# Patient Record
Sex: Female | Born: 1965 | Race: Black or African American | Hispanic: No | Marital: Single | Smoking: Never smoker
Health system: Southern US, Community
[De-identification: ages and names within clinical notes are randomized; demographics above are authoritative.]

## PROBLEM LIST (undated history)

## (undated) DIAGNOSIS — E079 Disorder of thyroid, unspecified: Secondary | ICD-10-CM

## (undated) DIAGNOSIS — F419 Anxiety disorder, unspecified: Secondary | ICD-10-CM

## (undated) DIAGNOSIS — F410 Panic disorder [episodic paroxysmal anxiety] without agoraphobia: Secondary | ICD-10-CM

## (undated) DIAGNOSIS — D369 Benign neoplasm, unspecified site: Secondary | ICD-10-CM

## (undated) DIAGNOSIS — F329 Major depressive disorder, single episode, unspecified: Secondary | ICD-10-CM

## (undated) DIAGNOSIS — E119 Type 2 diabetes mellitus without complications: Secondary | ICD-10-CM

## (undated) DIAGNOSIS — T7840XA Allergy, unspecified, initial encounter: Secondary | ICD-10-CM

## (undated) DIAGNOSIS — D329 Benign neoplasm of meninges, unspecified: Secondary | ICD-10-CM

## (undated) DIAGNOSIS — J45909 Unspecified asthma, uncomplicated: Secondary | ICD-10-CM

## (undated) DIAGNOSIS — M1990 Unspecified osteoarthritis, unspecified site: Secondary | ICD-10-CM

## (undated) DIAGNOSIS — R569 Unspecified convulsions: Secondary | ICD-10-CM

## (undated) DIAGNOSIS — F32A Depression, unspecified: Secondary | ICD-10-CM

## (undated) HISTORY — DX: Major depressive disorder, single episode, unspecified: F32.9

## (undated) HISTORY — DX: Panic disorder (episodic paroxysmal anxiety): F41.0

## (undated) HISTORY — DX: Unspecified osteoarthritis, unspecified site: M19.90

## (undated) HISTORY — DX: Benign neoplasm of meninges, unspecified: D32.9

## (undated) HISTORY — DX: Unspecified convulsions: R56.9

## (undated) HISTORY — DX: Allergy, unspecified, initial encounter: T78.40XA

## (undated) HISTORY — DX: Anxiety disorder, unspecified: F41.9

## (undated) HISTORY — DX: Depression, unspecified: F32.A

---

## 2006-06-25 HISTORY — PX: ABDOMINAL HYSTERECTOMY: SHX81

## 2009-06-25 DIAGNOSIS — D369 Benign neoplasm, unspecified site: Secondary | ICD-10-CM

## 2009-06-25 HISTORY — PX: OTHER SURGICAL HISTORY: SHX169

## 2009-06-25 HISTORY — DX: Benign neoplasm, unspecified site: D36.9

## 2014-03-04 ENCOUNTER — Encounter (HOSPITAL_COMMUNITY): Payer: Self-pay | Admitting: Emergency Medicine

## 2014-03-04 ENCOUNTER — Emergency Department (HOSPITAL_COMMUNITY)
Admission: EM | Admit: 2014-03-04 | Discharge: 2014-03-04 | Disposition: A | Discharge status: Home / Self Care | Payer: Medicare (Managed Care) | Attending: Emergency Medicine | Admitting: Emergency Medicine

## 2014-03-04 DIAGNOSIS — Z880 Allergy status to penicillin: Secondary | ICD-10-CM | POA: Diagnosis not present

## 2014-03-04 DIAGNOSIS — G43009 Migraine without aura, not intractable, without status migrainosus: Secondary | ICD-10-CM

## 2014-03-04 DIAGNOSIS — J45909 Unspecified asthma, uncomplicated: Secondary | ICD-10-CM | POA: Diagnosis not present

## 2014-03-04 DIAGNOSIS — Z86011 Personal history of benign neoplasm of the brain: Secondary | ICD-10-CM | POA: Insufficient documentation

## 2014-03-04 DIAGNOSIS — G43909 Migraine, unspecified, not intractable, without status migrainosus: Secondary | ICD-10-CM | POA: Insufficient documentation

## 2014-03-04 DIAGNOSIS — Z79899 Other long term (current) drug therapy: Secondary | ICD-10-CM | POA: Diagnosis not present

## 2014-03-04 DIAGNOSIS — IMO0002 Reserved for concepts with insufficient information to code with codable children: Secondary | ICD-10-CM | POA: Diagnosis not present

## 2014-03-04 DIAGNOSIS — F17200 Nicotine dependence, unspecified, uncomplicated: Secondary | ICD-10-CM | POA: Insufficient documentation

## 2014-03-04 DIAGNOSIS — E119 Type 2 diabetes mellitus without complications: Secondary | ICD-10-CM | POA: Diagnosis not present

## 2014-03-04 HISTORY — DX: Unspecified asthma, uncomplicated: J45.909

## 2014-03-04 HISTORY — DX: Benign neoplasm, unspecified site: D36.9

## 2014-03-04 HISTORY — DX: Type 2 diabetes mellitus without complications: E11.9

## 2014-03-04 HISTORY — DX: Disorder of thyroid, unspecified: E07.9

## 2014-03-04 MED ORDER — METOCLOPRAMIDE HCL 5 MG/ML IJ SOLN
10.0000 mg | Freq: Once | INTRAMUSCULAR | Status: AC
  Administered 2014-03-04: 10 mg via INTRAMUSCULAR
  Filled 2014-03-04: qty 2

## 2014-03-04 MED ORDER — BUTALBITAL-APAP-CAFFEINE 50-325-40 MG PO TABS
1.0000 | ORAL_TABLET | Freq: Once | ORAL | Status: AC
  Administered 2014-03-04: 1 via ORAL
  Filled 2014-03-04: qty 1

## 2014-03-04 MED ORDER — DIPHENHYDRAMINE HCL 50 MG/ML IJ SOLN
25.0000 mg | Freq: Once | INTRAMUSCULAR | Status: AC
  Administered 2014-03-04: 25 mg via INTRAMUSCULAR
  Filled 2014-03-04: qty 1

## 2014-03-04 MED ORDER — PROCHLORPERAZINE EDISYLATE 5 MG/ML IJ SOLN
10.0000 mg | Freq: Once | INTRAMUSCULAR | Status: AC
  Administered 2014-03-04: 10 mg via INTRAMUSCULAR
  Filled 2014-03-04: qty 2

## 2014-03-04 NOTE — Discharge Instructions (Signed)
Please follow up with your primary care physician in 1-2 days. If you do not have one please call the [place] number listed above. Please follow up with a neurology office to schedule a follow up appointment.  Please read all discharge instructions and return precautions.    Migraine Headache A migraine headache is an intense, throbbing pain on one or both sides of your head. A migraine can last for 30 minutes to several hours. CAUSES  The exact cause of a migraine headache is not always known. However, a migraine may be caused when nerves in the brain become irritated and release chemicals that cause inflammation. This causes pain. Certain things may also trigger migraines, such as:  Alcohol.  Smoking.  Stress.  Menstruation.  Aged cheeses.  Foods or drinks that contain nitrates, glutamate, aspartame, or tyramine.  Lack of sleep.  Chocolate.  Caffeine.  Hunger.  Physical exertion.  Fatigue.  Medicines used to treat chest pain (nitroglycerine), birth control pills, estrogen, and some blood pressure medicines. SIGNS AND SYMPTOMS  Pain on one or both sides of your head.  Pulsating or throbbing pain.  Severe pain that prevents daily activities.  Pain that is aggravated by any physical activity.  Nausea, vomiting, or both.  Dizziness.  Pain with exposure to bright lights, loud noises, or activity.  General sensitivity to bright lights, loud noises, or smells. Before you get a migraine, you may get warning signs that a migraine is coming (aura). An aura may include:  Seeing flashing lights.  Seeing bright spots, halos, or zigzag lines.  Having tunnel vision or blurred vision.  Having feelings of numbness or tingling.  Having trouble talking.  Having muscle weakness. DIAGNOSIS  A migraine headache is often diagnosed based on:  Symptoms.  Physical exam.  A CT scan or MRI of your head. These imaging tests cannot diagnose  migraines, but they can help rule out other causes of headaches. TREATMENT Medicines may be given for pain and nausea. Medicines can also be given to help prevent recurrent migraines.  HOME CARE INSTRUCTIONS  Only take over-the-counter or prescription medicines for pain or discomfort as directed by your health care provider. The use of long-term narcotics is not recommended.  Lie down in a dark, quiet room when you have a migraine.  Keep a journal to find out what may trigger your migraine headaches. For example, write down:  What you eat and drink.  How much sleep you get.  Any change to your diet or medicines.  Limit alcohol consumption.  Quit smoking if you smoke.  Get 7-9 hours of sleep, or as recommended by your health care provider.  Limit stress.  Keep lights dim if bright lights bother you and make your migraines worse. SEEK IMMEDIATE MEDICAL CARE IF:   Your migraine becomes severe.  You have a fever.  You have a stiff neck.  You have vision loss.  You have muscular weakness or loss of muscle control.  You start losing your balance or have trouble walking.  You feel faint or pass out.  You have severe symptoms that are different from your first symptoms. MAKE SURE YOU:   Understand these instructions.  Will watch your condition.  Will get help right away if you are not doing well or get worse. Document Released: 06/11/2005 Document Revised: 10/26/2013 Document Reviewed: 02/16/2013 [facility] Patient Information 2015 [place], [place]. This information is not intended to replace advice given to you by your health care provider.  Make sure you discuss any questions you have with your health care provider.

## 2014-03-04 NOTE — ED Notes (Signed)
Per EMS, pt comes from home with c/o severe migraine for the past 2 days. No fall or injury/trauma to head. No LOC. Pt A&OX4, NAD noted. 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 Pt is a diabetic. VSS: BP 140/82, P80, R18, 99% O2 rm air, CBG 99.

## 2014-03-04 NOTE — ED Notes (Addendum)
Piepenbrink,PA at bedside

## 2014-03-04 NOTE — ED Provider Notes (Signed)
CSN: [id number]     Arrival date & time 03/04/14  1235 History   First MD Initiated Contact with Patient 03/04/14 1238     Chief Complaint  Patient presents with  . Migraine     (Consider location/radiation/quality/duration/timing/severity/associated sxs/prior Treatment) HPI Comments: Patient is a 48 year old female past medical history significant for DM, asthma, benign right-sided brain tumor, coronary disease presented to the emergency department for 2 days of waxing and waning severe migraine. Describes it as throbbing in nature with associated photophobia and phonophobia. Endorses associated bilateral hand and foot tingling sensation. Patient has attempted to take her sumatriptan with no improvement of her headaches. Patient states she had a headache this severe last week. She states she is followed by a neurologist in [place], recently just moved to [place].  Patient is a 48 y.o. female presenting with migraines.  Migraine Associated symptoms include headaches.    Past Medical History  Diagnosis Date  . Diabetes mellitus without complication   . Asthma   . Benign tumor     right side of skull  . Thyroid disease    Past Surgical History  Procedure Laterality Date  . Abdominal hysterectomy     History reviewed. No pertinent family history. History  Substance Use Topics  . Smoking status: Current Every Day Smoker  . Smokeless tobacco: Not on file  . Alcohol Use: No   OB History   Grav Para Term Preterm Abortions TAB SAB Ect Mult Living                 Review of Systems  Eyes: Positive for photophobia.  Neurological: Positive for headaches. Negative for syncope.  All other systems reviewed and are negative.     Allergies  Penicillins and Iodine  Home Medications   Prior to Admission medications   Medication Sig Start Date End Date Taking? Authorizing Provider  albuterol (PROVENTIL HFA;VENTOLIN HFA) 108 (90 BASE) MCG/ACT inhaler Inhale 2 puffs into the  lungs every 6 (six) hours as needed for wheezing or shortness of breath.   Yes Historical Provider, MD  Alogliptin Benzoate (NESINA) 25 MG TABS Take 25 mg by mouth daily.   Yes Historical Provider, MD  amitriptyline (ELAVIL) 100 MG tablet Take 100 mg by mouth at bedtime.   Yes Historical Provider, MD  Beclomethasone Dipropionate (QNASL) 80 MCG/ACT AERS Place 1 spray into both nostrils 2 (two) times daily as needed (for allergies).   Yes Historical Provider, MD  budesonide-formoterol (SYMBICORT) 160-4.5 MCG/ACT inhaler Inhale 1 puff into the lungs 2 (two) times daily.   Yes Historical Provider, MD  Chlorzoxazone (LORZONE) 750 MG TABS Take 750 mg by mouth 3 (three) times daily.   Yes Historical Provider, MD  cyclobenzaprine (AMRIX) 15 MG 24 hr capsule Take 15 mg by mouth daily.   Yes Historical Provider, MD  Gabapentin Enacarbil (HORIZANT) 600 MG TBCR Take 600 mg by mouth daily.   Yes Historical Provider, MD  ibuprofen (ADVIL,MOTRIN) 800 MG tablet Take 800 mg by mouth 3 (three) times daily.   Yes Historical Provider, MD  levalbuterol (XOPENEX) 1.25 MG/3ML nebulizer solution Take 1.25 mg by nebulization every 4 (four) hours as needed for wheezing.   Yes Historical Provider, MD  levocetirizine (XYZAL) 5 MG tablet Take 5 mg by mouth every evening.   Yes Historical Provider, MD  Moxifloxacin HCl (MOXEZA) 0.5 % SOLN Place 1 drop into both eyes daily as needed (for allergies).   Yes Historical Provider, MD  SUMAtriptan (IMITREX) 100 MG  tablet Take 100 mg by mouth every 2 (two) hours as needed for migraine or headache. May repeat in 2 hours if headache persists or recurs.   Yes Historical Provider, MD  Topiramate ER 50 MG CP24 Take 50 mg by mouth daily.   Yes Historical Provider, MD  traZODone (DESYREL) 100 MG tablet Take 100 mg by mouth at bedtime.   Yes Historical Provider, MD   BP 103/69  Pulse 65  Temp(Src) 98 F (36.7 C) (Oral)  Resp 16  Ht 5\' 2"  (1.575 m)  Wt 173 lb (78.472 kg)  BMI 31.63 kg/m2   SpO2 100% Physical Exam  Nursing note and vitals reviewed. Constitutional: She is oriented to person, place, and time. She appears well-developed and well-nourished. No distress.  HENT:  Head: Normocephalic and atraumatic.  Right Ear: External ear normal.  Left Ear: External ear normal.  Nose: Nose normal.  Mouth/Throat: Oropharynx is clear and moist. No oropharyngeal exudate.  Eyes: Conjunctivae and EOM are normal. Pupils are equal, round, and reactive to light.  Neck: Normal range of motion. Neck supple.  Cardiovascular: Normal rate, regular rhythm, normal heart sounds and intact distal pulses.   Pulmonary/Chest: Effort normal and breath sounds normal. No respiratory distress.  Abdominal: Soft. There is no tenderness.  Musculoskeletal: Normal range of motion. She exhibits no edema.  Neurological: She is alert and oriented to person, place, and time. She has normal strength. No cranial nerve deficit. Gait normal. GCS eye subscore is 4. GCS verbal subscore is 5. GCS motor subscore is 6.  Sensation grossly intact.  No pronator drift.  Bilateral heel-knee-shin intact.  Skin: Skin is warm and dry. She is not diaphoretic.    ED Course  Procedures (including critical care time) Medications  prochlorperazine (COMPAZINE) injection 10 mg (10 mg Intramuscular Given 03/04/14 1316)  diphenhydrAMINE (BENADRYL) injection 25 mg (25 mg Intramuscular Given 03/04/14 1317)  metoCLOPramide (REGLAN) injection 10 mg (10 mg Intramuscular Given 03/04/14 1317)  butalbital-acetaminophen-caffeine (FIORICET, ESGIC) 50-325-40 MG per tablet 1 tablet (1 tablet Oral Given 03/04/14 1504)    Labs Review Labs Reviewed - No data to display  Imaging Review No results found.   EKG Interpretation None      MDM   Final diagnoses:  Migraine without aura and without status migrainosus, not intractable    Filed Vitals:   03/04/14 1601  BP: 103/69  Pulse: 65  Temp: 98 F (36.7 C)  Resp: 16   Afebrile, NAD,  non-toxic appearing, AAOx4.  Pt HA treated and improved while in ED.  Presentation is like pts typical HA and non concerning for [facility], ICH, Meningitis, or temporal arteritis. Pt is afebrile with no focal neuro deficits, nuchal rigidity, or change in vision. Pt is to follow up with PCP to discuss prophylactic medication. Pt verbalizes understanding and is agreeable with plan to dc. Patient is stable at time of discharge      Harlow Mares, PA-C 03/04/14 1608

## 2014-03-10 NOTE — ED Provider Notes (Signed)
Medical screening examination/treatment/procedure(s) were performed by non-physician practitioner and as supervising physician I was immediately available for consultation/collaboration.   EKG Interpretation None       Virgel Manifold, MD 03/10/14 1150

## 2014-10-05 ENCOUNTER — Ambulatory Visit (INDEPENDENT_AMBULATORY_CARE_PROVIDER_SITE_OTHER): Payer: PPO | Admitting: Emergency Medicine

## 2014-10-05 ENCOUNTER — Encounter: Payer: Self-pay | Admitting: Physician Assistant

## 2014-10-05 ENCOUNTER — Ambulatory Visit (INDEPENDENT_AMBULATORY_CARE_PROVIDER_SITE_OTHER): Payer: PPO

## 2014-10-05 VITALS — BP 124/74 | HR 97 | Temp 98.5°F | Resp 18 | Ht 62.25 in | Wt 189.0 lb

## 2014-10-05 DIAGNOSIS — J45909 Unspecified asthma, uncomplicated: Secondary | ICD-10-CM

## 2014-10-05 DIAGNOSIS — E890 Postprocedural hypothyroidism: Secondary | ICD-10-CM

## 2014-10-05 DIAGNOSIS — E785 Hyperlipidemia, unspecified: Secondary | ICD-10-CM

## 2014-10-05 DIAGNOSIS — D329 Benign neoplasm of meninges, unspecified: Secondary | ICD-10-CM | POA: Diagnosis not present

## 2014-10-05 DIAGNOSIS — G43709 Chronic migraine without aura, not intractable, without status migrainosus: Secondary | ICD-10-CM | POA: Diagnosis not present

## 2014-10-05 DIAGNOSIS — M25571 Pain in right ankle and joints of right foot: Secondary | ICD-10-CM

## 2014-10-05 DIAGNOSIS — E119 Type 2 diabetes mellitus without complications: Secondary | ICD-10-CM

## 2014-10-05 DIAGNOSIS — J3089 Other allergic rhinitis: Secondary | ICD-10-CM | POA: Diagnosis not present

## 2014-10-05 DIAGNOSIS — M1712 Unilateral primary osteoarthritis, left knee: Secondary | ICD-10-CM

## 2014-10-05 LAB — LIPID PANEL
Cholesterol: 148 mg/dL (ref 0–200)
HDL: 51 mg/dL (ref >=46)
LDL CALC: 81 mg/dL (ref 0–99)
Total CHOL/HDL Ratio: 2.9 Ratio
Triglycerides: 80 mg/dL (ref <150)
VLDL: 16 mg/dL (ref 0–40)

## 2014-10-05 LAB — POCT URINALYSIS DIPSTICK
Bilirubin, UA: NEGATIVE
Blood, UA: NEGATIVE
Ketones, UA: NEGATIVE
LEUKOCYTES UA: NEGATIVE
Nitrite, UA: NEGATIVE
PROTEIN UA: 30
Spec Grav, UA: 1.02
UROBILINOGEN UA: 0.2
pH, UA: 6.5

## 2014-10-05 LAB — COMPREHENSIVE METABOLIC PANEL
ALBUMIN: 3.9 g/dL (ref 3.5–5.2)
ALT: 20 U/L (ref 0–35)
AST: 22 U/L (ref 0–37)
Alkaline Phosphatase: 50 U/L (ref 39–117)
BUN: 7 mg/dL (ref 6–23)
CHLORIDE: 104 meq/L (ref 96–112)
CO2: 27 mEq/L (ref 19–32)
Calcium: 9.2 mg/dL (ref 8.4–10.5)
Creat: 0.97 mg/dL (ref 0.50–1.10)
Glucose, Bld: 86 mg/dL (ref 70–99)
POTASSIUM: 4.7 meq/L (ref 3.5–5.3)
Sodium: 138 mEq/L (ref 135–145)
TOTAL PROTEIN: 7.2 g/dL (ref 6.0–8.3)
Total Bilirubin: 0.2 mg/dL (ref 0.2–1.2)

## 2014-10-05 LAB — POCT UA - MICROSCOPIC ONLY
Casts, Ur, LPF, POC: NEGATIVE
WBC, Ur, HPF, POC: NEGATIVE
Yeast, UA: NEGATIVE

## 2014-10-05 LAB — TSH: TSH: 1.308 u[IU]/mL (ref 0.350–4.500)

## 2014-10-05 LAB — GLUCOSE, POCT (MANUAL RESULT ENTRY): POC GLUCOSE: 96 mg/dL (ref 70–99)

## 2014-10-05 LAB — POCT GLYCOSYLATED HEMOGLOBIN (HGB A1C): HEMOGLOBIN A1C: 5.5

## 2014-10-05 MED ORDER — LEVOTHYROXINE SODIUM 125 MCG PO TABS: 125.0000 ug | ORAL_TABLET | Freq: Every day | ORAL | Status: DC

## 2014-10-05 MED ORDER — IBUPROFEN 800 MG PO TABS: 800.0000 mg | ORAL_TABLET | Freq: Three times a day (TID) | ORAL | Status: DC

## 2014-10-05 MED ORDER — LEVOCETIRIZINE DIHYDROCHLORIDE 5 MG PO TABS: 5.0000 mg | ORAL_TABLET | Freq: Every evening | ORAL | Status: DC

## 2014-10-05 MED ORDER — SIMVASTATIN 40 MG PO TABS: 40.0000 mg | ORAL_TABLET | Freq: Every day | ORAL | Status: DC

## 2014-10-05 MED ORDER — ALOGLIPTIN BENZOATE 25 MG PO TABS: 25.0000 mg | ORAL_TABLET | Freq: Every day | ORAL | Status: DC

## 2014-10-05 MED ORDER — MONTELUKAST SODIUM 10 MG PO TABS: 10.0000 mg | ORAL_TABLET | Freq: Every day | ORAL | Status: DC

## 2014-10-05 MED ORDER — BUDESONIDE-FORMOTEROL FUMARATE 160-4.5 MCG/ACT IN AERO: 1.0000 | INHALATION_SPRAY | Freq: Two times a day (BID) | RESPIRATORY_TRACT | Status: DC

## 2014-10-05 MED ORDER — ALBUTEROL SULFATE HFA 108 (90 BASE) MCG/ACT IN AERS: 2.0000 | INHALATION_SPRAY | Freq: Four times a day (QID) | RESPIRATORY_TRACT | Status: DC | PRN

## 2014-10-05 MED ORDER — SUMATRIPTAN SUCCINATE 100 MG PO TABS: ORAL_TABLET | ORAL | Status: DC

## 2014-10-05 MED ORDER — SUMATRIPTAN SUCCINATE 100 MG PO TABS: 100.0000 mg | ORAL_TABLET | ORAL | Status: DC | PRN

## 2014-10-05 NOTE — Progress Notes (Signed)
  Medical screening examination/treatment/procedure(s) were performed by non-physician practitioner and as supervising physician I was immediately available for consultation/collaboration.     

## 2014-10-05 NOTE — Progress Notes (Signed)
HPI  Patient presents to establish care, medication refills, and knee pain.   Moved to [facility] 01/2014 for a better life from [place], [place]. Has multiple health problem and would like to get linked to specialist.   Needs refills of albuterol inhaler, symbicort, Xyzal for asthma and allergies. Has not run out of medication, but is running low. Has noticed more SOB and wheezing when weather changes for the past 2 months. No current SOB, wheezing, cough, or CP. Multiple environmental and indoor triggers. Was diagnosed with asthma at the age of 89. Has night time symptoms more days then doesn't.   Had meningioma discovered in 2011 when she first started having migraines. Is on multiple medications for migraines, but needs refills for Sumitriptan and Topiramate. Has migraines 4-5x/week and generally only sign that she is going to get one is back of neck gets stiff, but denies flashing lights, seeing spots, or odd taste/smell. Migraines are banging in quality and are triggered by allergy flares and stress. Mostly located on right side, but can be entire head. Last saw neurologist in 2012 and has since been followed by and internist in [place] who prescribes her medications. Denies changes in vision, dizziness, weakness, or anxiety.   Had thyroid removed 2011 due to mass that was determined to be benign. Due to entire thyroid removal is not hypothyroid and needs refill of levothyroxine. Has not had lapse in medication and endorses med compliance. Since surgery states that she started having problems with diabetes as well and needs refills of alogliptin. Does not monitor her diet and has notices since she has been off medication for past 2 months has gained weight. Checked fasting glucose approx 1 week ago and it was 138, but does not check often. States she walks 45 minutes 2-3x/week. Denies skin/hair changes, temp intolerance, polydipsia/phagia, numbness/tingling of hand/feet, or edema of legs.   Twisted right  ankle 1 month ago, however, has sprained same ankle several times previously. Was walking and twisted ankle then fell on same side. Had swelling that has improved. Pain is sharp and 8/10 when walking and only marginally better when resting. Pain does not radiate, but is felt medially and laterally. Epsom salt soaks temporarily relieve pain. Tries to wear high top shoes or boots for better support.   Has had left knee pain for past 8 months. Was dx with osteoarthritis previously and usually soaks knee or uses ibuprofen which both give some relief. Denies falls or trauma to knee. Pain worse in the morning and does not radiate. Sharp in quality as well. Was wearing a brace that helped, but lost it in the move. Denies loss of function/sensation/ROM, weakness, or numbness.   Smokes marijuana and tobacco daily. Drinks alcohol socially. Med allergy to PCN and iodine.   Review of Systems  As noted above.   Objective:   Physical Exam  Constitutional: She is oriented to person, place, and time. She appears well-developed and well-nourished. No distress.  Blood pressure 124/74, pulse 97, temperature 98.5 F (36.9 C), temperature source Oral, resp. rate 18, height 5' 2.25" (1.581 m), weight 189 lb (85.73 kg), SpO2 96 %.  HENT:  Head: Normocephalic and atraumatic.  Right Ear: External ear normal.  Left Ear: External ear normal.  Nose: Nose normal.  Mouth/Throat: Oropharynx is clear and moist. No oropharyngeal exudate.  Eyes: Conjunctivae and EOM are normal. Pupils are equal, round, and reactive to light. Right eye exhibits no discharge. Left eye exhibits no discharge.  Neck: Normal range  of motion. Neck supple. No JVD present. No thyromegaly present.  Cardiovascular: Normal rate, regular rhythm and normal heart sounds. Exam reveals no gallop and no friction rub.  No murmur heard.  Pulmonary/Chest: Effort normal and breath sounds normal. No respiratory distress. She has no wheezes. She has no rales.   Abdominal: Soft. Bowel sounds are normal. She exhibits no distension and no mass. There is no tenderness. There is no rebound and no guarding.  Musculoskeletal: Normal range of motion. She exhibits edema (left knee and right ankle) and tenderness.  Right knee: Normal.  Left knee: She exhibits swelling. She exhibits normal range of motion, no effusion, no ecchymosis, no deformity, no laceration, no erythema, normal alignment, no LCL laxity, normal patellar mobility, no bony tenderness, normal meniscus and no MCL laxity. Tenderness found. Medial joint line and lateral joint line tenderness noted.  Right ankle: She exhibits swelling (lateral malleolus). She exhibits normal range of motion, no ecchymosis, no deformity, no laceration and normal pulse. Tenderness. Lateral malleolus tenderness found. No medial malleolus, no AITFL, no CF ligament, no posterior TFL, no head of 5th metatarsal and no proximal fibula tenderness found. Achilles tendon normal.  Left ankle: Normal.  Lymphadenopathy:  She has no cervical adenopathy.  Neurological: She is alert and oriented to person, place, and time. She has normal reflexes. No cranial nerve deficit. She exhibits normal muscle tone. Coordination normal.  Skin: Skin is warm and dry. No rash noted. She is not diaphoretic. No erythema. No pallor.   Results for orders placed or performed in visit on 10/05/14   POCT glycosylated hemoglobin (Hb A1C)   Result  Value  Ref Range    Hemoglobin A1C  5.5    POCT glucose (manual entry)   Result  Value  Ref Range    POC Glucose  96  70 - 99 mg/dl   POCT UA - Microscopic Only   Result  Value  Ref Range    WBC, Ur, HPF, POC  neg     RBC, urine, microscopic  0-2     Bacteria, U Microscopic  4+     Mucus, UA  small     Epithelial cells, urine per micros  tntc     Crystals, Ur, HPF, POC  eng     Casts, Ur, LPF, POC  neg     Yeast, UA  neg    POCT urinalysis dipstick   Result  Value  Ref Range    Color, UA  yellow      Clarity, UA  cloudy     Glucose, UA  eng     Bilirubin, UA  neg     Ketones, UA  neg     Spec Grav, UA  1.020     Blood, UA  neg     pH, UA  6.5     Protein, UA  30     Urobilinogen, UA  0.2     Nitrite, UA  neg     Leukocytes, UA  Negative     [facility] reading (PRIMARY) by Dr. Ouida Sills. No acute fractures.   Assessment & Plan:   1. Type 2 diabetes mellitus without complication  Discussed lifestyle modifications.  - POCT glycosylated hemoglobin (Hb A1C)  - POCT glucose (manual entry)  - POCT UA - Microscopic Only  - POCT urinalysis dipstick  - Alogliptin Benzoate (NESINA) 25 MG TABS; Take 25 mg by mouth daily. Dispense: 30 tablet; Refill: 3   2. Dyslipidemia  - Lipid  panel  - Comprehensive metabolic panel  - simvastatin (ZOCOR) 40 MG tablet; Take 1 tablet (40 mg total) by mouth daily. Dispense: 30 tablet; Refill: 3   3. Postoperative hypothyroidism  - TSH  - levothyroxine (SYNTHROID, LEVOTHROID) 125 MCG tablet; Take 1 tablet (125 mcg total) by mouth daily before breakfast. Dispense: 30 tablet; Refill: 3   4. Asthma, chronic, unspecified asthma severity, uncomplicated  - budesonide-formoterol (SYMBICORT) 160-4.5 MCG/ACT inhaler; Inhale 1 puff into the lungs 2 (two) times daily. Dispense: 1 Inhaler; Refill: 4  - montelukast (SINGULAIR) 10 MG tablet; Take 1 tablet (10 mg total) by mouth at bedtime. Dispense: 30 tablet; Refill: 11  - albuterol (PROVENTIL HFA;VENTOLIN HFA) 108 (90 BASE) MCG/ACT inhaler; Inhale 2 puffs into the lungs every 6 (six) hours as needed for wheezing or shortness of breath. Dispense: 1 Inhaler; Refill: 4   5. Environmental and seasonal allergies  - levocetirizine (XYZAL) 5 MG tablet; Take 1 tablet (5 mg total) by mouth every evening. Dispense: 30 tablet; Refill: 11  - montelukast (SINGULAIR) 10 MG tablet; Take 1 tablet (10 mg total) by mouth at bedtime. Dispense: 30 tablet; Refill: 11   6. Chronic migraine without aura without status migrainosus, not  intractable  - SUMAtriptan (IMITREX) 100 MG tablet; Take 1 tablet po for migraine. May repeat in 2 hours if migraine persists or recurs. Dispense: 10 tablet; Refill: 1  - Ambulatory referral to Neurology   7. Primary osteoarthritis of left knee  - ibuprofen (ADVIL,MOTRIN) 800 MG tablet; Take 1 tablet (800 mg total) by mouth 3 (three) times daily. Dispense: 30 tablet; Refill: 2   8. Meningioma  - Ambulatory referral to Neurology   9. Ankle pain, right  Ice 3-4x daily for 15-30 minutes. Given air cast to be worn for next week.  - DG Ankle Complete Right; Future  - ibuprofen (ADVIL,MOTRIN) 800 MG tablet; Take 1 tablet (800 mg total) by mouth 3 (three) times daily. Dispense: 30 tablet; Refill: 2    Alveta Heimlich PA-C  [facility]  10/05/2014 3:43 PM

## 2014-11-05 ENCOUNTER — Ambulatory Visit: Payer: Medicare Other | Admitting: Neurology

## 2014-11-30 ENCOUNTER — Telehealth: Payer: Self-pay | Admitting: Physician Assistant

## 2014-11-30 NOTE — Telephone Encounter (Signed)
Left vmail. Knee and back problems were not addressed at last visit has ankle was evaluated so not sure what the forms are for. If theses are supports you need should come back in for further evaluation.

## 2014-12-08 ENCOUNTER — Ambulatory Visit: Payer: Medicare Other | Admitting: Neurology

## 2014-12-09 ENCOUNTER — Encounter: Payer: Self-pay | Admitting: Neurology

## 2015-02-01 ENCOUNTER — Ambulatory Visit (INDEPENDENT_AMBULATORY_CARE_PROVIDER_SITE_OTHER): Payer: Commercial Managed Care - HMO | Admitting: Family Medicine

## 2015-02-01 VITALS — BP 116/72 | HR 81 | Temp 98.4°F | Resp 16 | Ht 62.25 in | Wt 183.0 lb

## 2015-02-01 DIAGNOSIS — M1712 Unilateral primary osteoarthritis, left knee: Secondary | ICD-10-CM

## 2015-02-01 DIAGNOSIS — M1990 Unspecified osteoarthritis, unspecified site: Secondary | ICD-10-CM

## 2015-02-01 DIAGNOSIS — J3089 Other allergic rhinitis: Secondary | ICD-10-CM

## 2015-02-01 DIAGNOSIS — E119 Type 2 diabetes mellitus without complications: Secondary | ICD-10-CM | POA: Diagnosis not present

## 2015-02-01 DIAGNOSIS — D329 Benign neoplasm of meninges, unspecified: Secondary | ICD-10-CM | POA: Diagnosis not present

## 2015-02-01 DIAGNOSIS — E785 Hyperlipidemia, unspecified: Secondary | ICD-10-CM | POA: Diagnosis not present

## 2015-02-01 DIAGNOSIS — R05 Cough: Secondary | ICD-10-CM

## 2015-02-01 DIAGNOSIS — G43709 Chronic migraine without aura, not intractable, without status migrainosus: Secondary | ICD-10-CM

## 2015-02-01 DIAGNOSIS — R059 Cough, unspecified: Secondary | ICD-10-CM

## 2015-02-01 DIAGNOSIS — E890 Postprocedural hypothyroidism: Secondary | ICD-10-CM

## 2015-02-01 DIAGNOSIS — J45909 Unspecified asthma, uncomplicated: Secondary | ICD-10-CM

## 2015-02-01 LAB — POCT GLYCOSYLATED HEMOGLOBIN (HGB A1C): Hemoglobin A1C: 6

## 2015-02-01 LAB — MICROALBUMIN, URINE: Microalb, Ur: 36.6 mg/dL — ABNORMAL HIGH (ref <2.0)

## 2015-02-01 MED ORDER — CYCLOBENZAPRINE HCL ER 15 MG PO CP24: 15.0000 mg | ORAL_CAPSULE | Freq: Every day | ORAL | Status: DC

## 2015-02-01 MED ORDER — ALOGLIPTIN BENZOATE 25 MG PO TABS: 25.0000 mg | ORAL_TABLET | Freq: Every day | ORAL | Status: DC

## 2015-02-01 MED ORDER — BECLOMETHASONE DIPROPIONATE 80 MCG/ACT NA AERS: 1.0000 | INHALATION_SPRAY | Freq: Two times a day (BID) | NASAL | Status: DC | PRN

## 2015-02-01 MED ORDER — OMEPRAZOLE 40 MG PO CPDR: 40.0000 mg | DELAYED_RELEASE_CAPSULE | Freq: Every day | ORAL | Status: DC

## 2015-02-01 MED ORDER — GABAPENTIN ENACARBIL ER 600 MG PO TBCR: 600.0000 mg | EXTENDED_RELEASE_TABLET | Freq: Every day | ORAL | Status: DC

## 2015-02-01 MED ORDER — TOPIRAMATE ER 50 MG PO CAP24: 50.0000 mg | ORAL_CAPSULE | Freq: Every day | ORAL | Status: DC

## 2015-02-01 MED ORDER — ALBUTEROL SULFATE HFA 108 (90 BASE) MCG/ACT IN AERS: 2.0000 | INHALATION_SPRAY | Freq: Four times a day (QID) | RESPIRATORY_TRACT | Status: DC | PRN

## 2015-02-01 MED ORDER — MONTELUKAST SODIUM 10 MG PO TABS: 10.0000 mg | ORAL_TABLET | Freq: Every day | ORAL | Status: DC

## 2015-02-01 MED ORDER — BUDESONIDE-FORMOTEROL FUMARATE 160-4.5 MCG/ACT IN AERO: 1.0000 | INHALATION_SPRAY | Freq: Two times a day (BID) | RESPIRATORY_TRACT | Status: DC

## 2015-02-01 MED ORDER — SIMVASTATIN 40 MG PO TABS: 40.0000 mg | ORAL_TABLET | Freq: Every day | ORAL | Status: DC

## 2015-02-01 MED ORDER — HYDROCODONE-HOMATROPINE 5-1.5 MG/5ML PO SYRP: 5.0000 mL | ORAL_SOLUTION | Freq: Every evening | ORAL | Status: DC | PRN

## 2015-02-01 MED ORDER — IBUPROFEN 800 MG PO TABS: 800.0000 mg | ORAL_TABLET | Freq: Three times a day (TID) | ORAL | Status: DC

## 2015-02-01 MED ORDER — AMITRIPTYLINE HCL 100 MG PO TABS: 100.0000 mg | ORAL_TABLET | Freq: Every day | ORAL | Status: AC

## 2015-02-01 MED ORDER — TRAZODONE HCL 100 MG PO TABS: ORAL_TABLET | ORAL | Status: DC

## 2015-02-01 MED ORDER — LEVOTHYROXINE SODIUM 125 MCG PO TABS: 125.0000 ug | ORAL_TABLET | Freq: Every day | ORAL | Status: DC

## 2015-02-01 MED ORDER — LEVOCETIRIZINE DIHYDROCHLORIDE 5 MG PO TABS: 5.0000 mg | ORAL_TABLET | Freq: Every evening | ORAL | Status: DC

## 2015-02-01 MED ORDER — SUMATRIPTAN SUCCINATE 100 MG PO TABS: ORAL_TABLET | ORAL | Status: DC

## 2015-02-01 NOTE — Patient Instructions (Signed)
RESOURCE GUIDE ° °Chronic Pain Problems: °Contact [facility]  [phone] °Patients need to be referred by their primary care doctor. ° °Insufficient Money for Medicine: °Contact United Way:  call (888) 892-1162 ° °No Primary Care Doctor: °- Call [facility]  [phone] - can help you locate a primary care doctor that  accepts your insurance, provides certain services, etc. °- Physician Referral Service- 1-800-533-3463 ° °Agencies that provide inexpensive medical care: °- [facility]  [phone] °- [facility]  [phone] °- [facility]  [phone] °- [facility]  [phone] °- [facility]  [phone] °- [facility]  272-1050 ° °Medicaid-accepting [place] Providers: °- [facility]- [address] ° [phone], Mon-Fri 9am-7pm, Sat 9am-1pm °- [facility]- [address], [address] ° [phone] °- [facility]- [address], [address] ° [phone] °- [facility]- [address] [place] [address] ° [phone] °- [facility]- [address], [phone] ° Only accepts [place] Medicaid patients after they have their name  applied to their card ° °Self Pay (no insurance) in [place]: °- Sickle Cell Patients - [facility] ° [address], [phone] °- [facility]- [address] ° [phone] °      -     [facility]- [address] °      -     [facility]- see information above (Speak to Pam H if you do not have insurance) °      -  [facility] [place]- [address],  [phone] °      -  [facility]- [address] [place] [address], [phone] °      -  Dr Osei-Bonsu-  [address], [place], [phone] °      -  [facility] - [address], [phone] °      -  [facility]- [address] [place] [address], [phone], also  [address], [phone] °      -     [facility]- [address], [phone], 1st & 3rd Saturday °        every month, 10am-1pm ° -     [facility] °  [address], [place]. °  Phone:  [phone], Fax:  [phone]. Hours of Operation:  9 am - 6 pm, M-F. ° -     [facility] °  [address], [place] °  Phone: [phone], Fax: [phone]. Hours of Operation:  8:30 am - 5:30 pm, M-F. ° °[facility] °[address] °[place], [place] [address] °(336) 832-4777 ° °[facility] °[address] °[place], [place] [address] °(336) [phone] ° °1) Find a Doctor and Pay Out of Pocket °Although you won't have to find out who is covered by your insurance plan, it is a good idea to ask around and get recommendations. You will then need to call the office and see if the doctor you have chosen will accept you as a new patient and what types of options they offer for patients who are self-pay. Some doctors offer discounts or will set up payment plans for their patients who do not have insurance, but   you will need to ask so you aren't surprised when you get to your appointment. ° °2) Contact Your Local Health Department °Not all health departments have doctors that can see patients for sick visits, but many do, so it is worth a call to see if yours does. If you don't know where your local health department is, you can check in your phone book. The CDC also has a tool to help you locate your state's health department, and many state websites also have listings of all of their local health departments. ° °3) Find a Walk-in Clinic °If your illness is not likely to be very severe or complicated, you may want to try a walk in clinic. These are popping up all over the country in pharmacies, drugstores, and shopping centers. They're usually staffed by nurse practitioners or physician assistants that have been trained  to treat common illnesses and complaints. They're usually fairly quick and inexpensive. However, if you have serious medical issues or chronic medical problems, these are probably not your best option ° °STD Testing °- [place] [facility] [place], [facility], [address], [place], phone [phone] or [phone].  Monday - Friday, call for an appointment. °- [place] [facility] [place], [facility], [address], [place], phone [phone] or [phone].  Monday - Friday, call for an appointment. ° °Abuse/Neglect: °- [place] Child Abuse Hotline (336) 641-3795 °- [place] Child Abuse Hotline 800-378-5315 (After Hours) ° °Emergency Shelter:  [facility] (336) 271-5985 ° °Maternity Homes: °- [facility] (336) 275-9566 °- [facility] (704) 372-4663 ° °MRSA Hotline #:   832-7006 ° °Dental Assistance °If unable to pay or uninsured, contact:  [place] [facility] to become qualified for the adult dental clinic. ° °Patients with Medicaid: [facility] °[address], 632-0744 °[address], 510-2600 ° °If unable to pay, or uninsured, contact [place] [facility] ([phone] in [place], 842-7733 in [place]) to become qualified for the adult dental clinic ° °[facility] °[address] °[place], [place] [address] °(336) 272-4177 °www.[facility].com ° °Other Low-Cost Community Dental Services: °- [facility]- [address], [place], [place], [address], 723-1848, Ext. 123, 2nd and 4th Thursday of the month at 6:30am.  10 clients each day by appointment, can sometimes see walk-in patients if someone does not show for an appointment. °- [facility]- [address], [place], [place], [address], [phone] °- [facility]- [address], [place], [place], [address], [phone] °- [place]  [facility]- 342-8273 °- [facility]- 703-3100 °- [facility]- 570-6415 ° °     Behavioral Health Resources in the Community ° °Intensive Outpatient Programs: °[facility]      °[address] °[place], [place] °336-878-6098 °Both a day and evening program °      °[facility]     °[address]        °[place], [place] [address] °336-832-9800        ° °[facility] °[address] °[place] °336-882-2125 ° °[facility] °ACCESS LINE: 1-800-853-5163 or 336-641-4981 °[address] °[place], [place] [address] °Http://www.[email or web address].[email or web address]/adult.htm ° ° °Substance Abuse Resources: °- [facility]  336-882-2125 °- [facility] 336-784-9470 °- [facility] 336-285-9073 °- [facility] 336-845-3988 °-   Residential & Outpatient Substance Abuse Program  800-659-3381 ° °Psychological Services: °- [facility]  832-9600 °- [facility]  378-7881 °- [facility], [address], [place], ACCESS LINE: 1-800-853-5163 or 336-641-4981, Http://www.[facility].com/services/adult.htm ° °Mobile Crisis Teams:         °                               °[facility]         °Mobile Crisis Care Unit °1-877-626-1772       °      °Assertive °[facility] °[address] [place] °336-834-9664 °                                        °Interventionist °Sharon DeEsch °[address] °[place] °336-554-5454 ° °Self-Help/Support Groups: °[facility] Variety of support groups °373-1402 (call for more info) ° °Narcotics Anonymous (NA) °[facility] °[address] °[place] - 2 meetings at this location ° °Residential Treatment Programs:  °[facility]      °[address]        °[place]       °866-801-8205        ° °[facility] °[address] °[place], [place]  [address] °704-293-8524 ° °[facility]  °[address] °[place], [place] [address] °336-845-3988 °Admissions: 8am-3pm M-F ° °[facility]     °[address]        °[place], [place] [address]       °336-841-1104        ° °[facility] °[address] °[place], [place] °336-379-7146 ° °[facility] °[address] °[place], [place] °336-285-9073 ° °[facility] - Intensive Outpatient      °[address]     °[place], [place]       °852-3033        ° °[facility]     °[address] °[place], [place] °877-615-2722 or 336-784-9470 ° °[facility]), Medicaid °[address] °[place], [place] °336-227-7417 ° °[facility]                                               °[address] ° [place] °800-659-3381 ° °[place] [facility]: °[facility]- 1-888-581-9988              ° °General Therapy                                                °Julie Brannon, PhD        °[address]                                       °[place], [place] [address]         °336-349-5553   °Insurance ° °[facility]   °[address] °[place], [place] [address] °336-349-4454 ° °[facility] °[address] [place], [place] [address] °336-342-8316 °Insurance/Medicaid/sponsorship through [facility] ° °[facility]                                              °[address]                                        °[place], [place] [address]    °Therapy/tele-psych/case         °336-342-8316        °  °[facility] °[address]  ° [place], [place]  [address]  °Adolescent/group home/case management °336-349-2233  °                                         °Julia Brannon PhD       °General therapy       °Insurance   °336-951-0000        ° °Dr. Arfeen, Insurance, M-F °336- 349-4544 ° °[facility] [place]  [facility]  [employer] [place] [facility] °[address]                 [address]         [address]  °[place]                                               [place]                              [place] °Phone:  [phone]                                  Phone:  [phone]                   Phone:  342-8140 ° °[facility], 342-8316 °- [place] [facility] - [facility]- 1-888-581-9988 °      -     [facility] in , [address], °            336-349-4454, Insurance ° °[place] Child Abuse Hotline °(336) 342-1394 or (336) 342-3537 (After Hours) ° ° °

## 2015-02-01 NOTE — Progress Notes (Signed)
Chief Complaint:  Chief Complaint  Patient presents with  . Medication Refill    Topamax, Sumitriptan and Horizant  . Migraine    HPI: Misty Franco is a 49 y.o. female who reports to [facility] today complaining of:  Needs medication for her migraine HAs, she did not get all of her meds as she thought she was going to. She was referred to neuro but did not make it since she does not have a car and rides her bike around. But when it is really hot she can;t because her sathma flares up. Sh ehas ahd a hx of  Migraine headache and has all sorts of  trigggers, she recently has had headaches for the last 1/2 month, last month she had it for th ewhoe month. She is having chills and sweats, LMP was 2008, partial hysterctomy due to benign fibroids.  She has spots and and also numbness and tingling in her arms and and lhands and legs. Has nause and vomiting with HA. She has had worsening sxs. She was only on imitrex and not her prior regiment so that is why she is having these long lasting headaches. She was taking Horizant/gabapentin, topomax 100 mg and imitrex and has had injections of imitrex in the past. Had meningioma discovered in 2011 when she first started having migraines. Is on multiple medications for migraines, but needs refills for Sumitriptan and Topiramate. Has migraines 4-5x/week and generally only sign that she is going to get one is back of neck gets stiff, but denies flashing lights, seeing spots, or odd taste/smell. Migraines are banging in quality and are triggered by allergy flares and stress. Mostly located on right side, but can be entire head. Last saw neurologist in 2012 and has since been followed by and internist in [place] who prescribes her medications. Denies changes in vision, dizziness  Please see Misty's note from prior with good detailed hx of all her current ailments.   She has chronic SOB due to asthma, she has nebulizers. Needs refills onmeds  She needs refill son her  thyroid meds as well.   She needs hers PPI for GERD  She also needs meds for her insomnia she was on trazadone and also amitryptiline  She also needs her diabetes medication    Past Medical History  Diagnosis Date  . Diabetes mellitus without complication   . Asthma   . Benign tumor     right side of skull  . Thyroid disease   . Allergy   . Anxiety   . Arthritis   . Depression   . Meningioma    Past Surgical History  Procedure Laterality Date  . Abdominal hysterectomy     History   Social History  . Marital Status: Single    Spouse Name: N/A  . Number of Children: N/A  . Years of Education: N/A   Social History Main Topics  . Smoking status: Never Smoker   . Smokeless tobacco: Never Used  . Alcohol Use: No  . Drug Use: 7.00 per week    Special: Marijuana     Comment: use 7x week   . Sexual Activity: Not on file   Other Topics Concern  . None   Social History Narrative   History reviewed. No pertinent family history. Allergies  Allergen Reactions  . Penicillins Swelling  . Iodine Dermatitis   Prior to Admission medications   Medication Sig Start Date End Date Taking? Authorizing Provider  albuterol (PROVENTIL  HFA;VENTOLIN HFA) 108 (90 BASE) MCG/ACT inhaler Inhale 2 puffs into the lungs every 6 (six) hours as needed for wheezing or shortness of breath. 10/05/14  Yes Misty R Brewington, PA-C  amitriptyline (ELAVIL) 100 MG tablet Take 100 mg by mouth at bedtime.   Yes Historical Provider, MD  Beclomethasone Dipropionate (QNASL) 80 MCG/ACT AERS Place 1 spray into both nostrils 2 (two) times daily as needed (for allergies).   Yes Historical Provider, MD  budesonide-formoterol (SYMBICORT) 160-4.5 MCG/ACT inhaler Inhale 1 puff into the lungs 2 (two) times daily. 10/05/14  Yes Misty R Brewington, PA-C  Gabapentin Enacarbil (HORIZANT) 600 MG TBCR Take 600 mg by mouth daily.   Yes Historical Provider, MD  ibuprofen (ADVIL,MOTRIN) 800 MG tablet Take 1 tablet (800  mg total) by mouth 3 (three) times daily. 10/05/14  Yes Misty R Brewington, PA-C  Ibuprofen-Famotidine 800-26.6 MG TABS Take by mouth.   Yes Historical Provider, MD  levalbuterol (XOPENEX) 1.25 MG/3ML nebulizer solution Take 1.25 mg by nebulization every 4 (four) hours as needed for wheezing.   Yes Historical Provider, MD  levothyroxine (SYNTHROID, LEVOTHROID) 125 MCG tablet Take 1 tablet (125 mcg total) by mouth daily before breakfast. 10/05/14  Yes Misty R Brewington, PA-C  montelukast (SINGULAIR) 10 MG tablet Take 1 tablet (10 mg total) by mouth at bedtime. 10/05/14  Yes Misty R Brewington, PA-C  simvastatin (ZOCOR) 40 MG tablet Take 1 tablet (40 mg total) by mouth daily. 10/05/14  Yes Misty R Brewington, PA-C  SUMAtriptan (IMITREX) 100 MG tablet Take 1 tablet po for migraine. May repeat in 2 hours if migraine persists or recurs. 10/05/14  Yes Misty R Brewington, PA-C  Topiramate ER 50 MG CP24 Take 50 mg by mouth daily.   Yes Historical Provider, MD  traZODone (DESYREL) 100 MG tablet Take 100 mg by mouth at bedtime.   Yes Historical Provider, MD  Vitamin D, Ergocalciferol, (DRISDOL) 50000 UNITS CAPS capsule Take 50,000 Units by mouth every 7 (seven) days.   Yes Historical Provider, MD  Alogliptin Benzoate (NESINA) 25 MG TABS Take 25 mg by mouth daily. 10/05/14   Misty R Brewington, PA-C  Chlorzoxazone (LORZONE) 750 MG TABS Take 750 mg by mouth 3 (three) times daily.    Historical Provider, MD  cyclobenzaprine (AMRIX) 15 MG 24 hr capsule Take 15 mg by mouth daily.    Historical Provider, MD  Moxifloxacin HCl (MOXEZA) 0.5 % SOLN Place 1 drop into both eyes daily as needed (for allergies).    Historical Provider, MD  olopatadine (PATANOL) 0.1 % ophthalmic solution 1 drop 2 (two) times daily.    Historical Provider, MD  omeprazole (PRILOSEC) 40 MG capsule Take 40 mg by mouth daily.    Historical Provider, MD     ROS: The patient denies fevers, chills, night sweats, unintentional weight loss,  chest pain, palpitations, wheezing, dyspnea on exertion, nausea, vomiting, abdominal pain, dysuria, hematuria, melena  All other systems have been reviewed and were otherwise negative with the exception of those mentioned in the HPI and as above.    PHYSICAL EXAM: Filed Vitals:   02/01/15 0814  BP: 116/72  Pulse: 81  Temp: 98.4 F (36.9 C)  Resp: 16   Body mass index is 33.21 kg/(m^2).   General: Alert, no acute distress HEENT:  Normocephalic, atraumatic, oropharynx patent. EOMI, PERRLA, fudnoc exam nl, tm normal Cardiovascular:  Regular rate and rhythm, no rubs murmurs or gallops.  No Carotid bruits, radial pulse intact. No pedal edema.  Respiratory: Clear to  auscultation bilaterally.  No wheezes, rales, or rhonchi.  No cyanosis, no use of accessory musculature Abdominal: No organomegaly, abdomen is soft and non-tender, positive bowel sounds. No masses. Skin: No rashes. Neurologic: Facial musculature symmetric. Psychiatric: Patient acts appropriately throughout our interaction. Lymphatic: No cervical or submandibular lymphadenopathy Musculoskeletal: Gait intact. No edema, + minimal tender right ankle malleoli 5/5 strength, 2/2 DTRS   LABS: Results for orders placed or performed in visit on 02/01/15  POCT glycosylated hemoglobin (Hb A1C)  Result Value Ref Range   Hemoglobin A1C 6.0      EKG/XRAY:   Primary read interpreted by Dr. Marin Comment at [facility].   ASSESSMENT/PLAN: Encounter Diagnoses  Name Primary?  . Chronic migraine without aura without status migrainosus, not intractable Yes  . Meningioma   . Type 2 diabetes mellitus without complication   . Postoperative hypothyroidism   . Asthma, chronic, unspecified asthma severity, uncomplicated   . Arthritis   . Dyslipidemia   . Environmental and seasonal allergies   . Primary osteoarthritis of left knee   . Cough    Labs pending Refilled meds but I have decided that she should be on 1 and not 2 insomnia meds, I have asked  her to taper down and then stop trazadone over 2 week period, she can cont with Amitrytiline Also I am only going to give her 1 msk relaxer rather than 2 Her migraine headaches are complicated and so is her medicine regiment, we will need speciality care for this Refer to neurology for migraine headaches  Refer to [facility] for improved continuity of care Rx hycodan for cough since when she coughs exacerbates her asthma nd incontinence She was given a ankle sweedo for right ankle sprain thathas not improved  Gross sideeffects, risk and benefits, and alternatives of medications d/w patient. Patient is aware that all medications have potential sideeffects and we are unable to predict every sideeffect or drug-drug interaction that may occur.  Thao Le DO  02/01/2015 9:26 AM

## 2015-02-02 DIAGNOSIS — E119 Type 2 diabetes mellitus without complications: Secondary | ICD-10-CM | POA: Insufficient documentation

## 2015-02-02 DIAGNOSIS — M1990 Unspecified osteoarthritis, unspecified site: Secondary | ICD-10-CM | POA: Insufficient documentation

## 2015-02-02 DIAGNOSIS — D329 Benign neoplasm of meninges, unspecified: Secondary | ICD-10-CM | POA: Insufficient documentation

## 2015-02-02 DIAGNOSIS — J45909 Unspecified asthma, uncomplicated: Secondary | ICD-10-CM | POA: Insufficient documentation

## 2015-02-02 DIAGNOSIS — G43709 Chronic migraine without aura, not intractable, without status migrainosus: Secondary | ICD-10-CM | POA: Insufficient documentation

## 2015-02-02 DIAGNOSIS — J3089 Other allergic rhinitis: Secondary | ICD-10-CM | POA: Insufficient documentation

## 2015-02-02 DIAGNOSIS — E890 Postprocedural hypothyroidism: Secondary | ICD-10-CM | POA: Insufficient documentation

## 2015-02-02 DIAGNOSIS — E785 Hyperlipidemia, unspecified: Secondary | ICD-10-CM | POA: Insufficient documentation

## 2015-02-25 ENCOUNTER — Encounter: Payer: Self-pay | Admitting: Family Medicine

## 2015-03-01 ENCOUNTER — Ambulatory Visit (INDEPENDENT_AMBULATORY_CARE_PROVIDER_SITE_OTHER): Payer: Commercial Managed Care - HMO | Admitting: Diagnostic Neuroimaging

## 2015-03-01 ENCOUNTER — Encounter: Payer: Self-pay | Admitting: Diagnostic Neuroimaging

## 2015-03-01 VITALS — BP 130/82 | HR 76 | Ht 62.25 in | Wt 198.0 lb

## 2015-03-01 DIAGNOSIS — G43101 Migraine with aura, not intractable, with status migrainosus: Secondary | ICD-10-CM | POA: Insufficient documentation

## 2015-03-01 DIAGNOSIS — D329 Benign neoplasm of meninges, unspecified: Secondary | ICD-10-CM

## 2015-03-01 DIAGNOSIS — G43709 Chronic migraine without aura, not intractable, without status migrainosus: Secondary | ICD-10-CM

## 2015-03-01 MED ORDER — RIZATRIPTAN BENZOATE 10 MG PO TBDP: 10.0000 mg | ORAL_TABLET | ORAL | Status: DC | PRN

## 2015-03-01 MED ORDER — TOPIRAMATE ER 50 MG PO CAP24: 100.0000 mg | ORAL_CAPSULE | Freq: Every day | ORAL | Status: DC

## 2015-03-01 MED ORDER — TOPIRAMATE ER 50 MG PO CAP24: 50.0000 mg | ORAL_CAPSULE | Freq: Every day | ORAL | Status: DC

## 2015-03-01 NOTE — Progress Notes (Signed)
[facility]  PATIENT: Misty Franco DOB: Oct 16, 1965  REFERRING CLINICIAN: T Le  HISTORY FROM: patient  REASON FOR VISIT: new consult     HISTORICAL  CHIEF COMPLAINT:  Chief Complaint  Patient presents with  . Migraine    rm 7, New Patient,    HISTORY OF PRESENT ILLNESS:   49 year old right-handed female here for evaluation of migraines and meningioma. Patient has history of diabetes, hypercholesteremia, depression, anxiety, muscle spasm, anxiety, panic attack, asthma.  Patient reports history of migraine headaches since age 68 years old. She describes pounding, exploding sensation on front, back, global or bitemporal regions. Sometimes headaches are unilateral. These are associated at times with nausea, vomiting, photophobia, phonophobia. Sometimes she sees stars and sparkles before the headache. Sometimes she feels dizziness and presyncope symptoms leading up to the headaches. Triggering factors include stress, sunlight, asthma, asthma inhalers.  Patient has been treated with topiramate, gabapentin, sumatriptan, with mild relief in the past. Patient was having 1-3 days of headache per week. In the past 3 months she has been having 6-7 days of headache per week. Sometimes headaches last up to 2-3 weeks that time.  Patient moved here from [place] and November 2015. Patient now try to establish medical care locally.   REVIEW OF SYSTEMS: Full 14 system review of systems performed and notable only for insomnia snoring headache numbness weakness dizziness depression anxiety decreased energy hallucinations allergy skin sensitivity joint pain joint swelling aching muscles feeling hot and cold increased thirst flushing a bruising blurred vision double vision shortness of breath wheezing snoring swelling in legs hearing loss in the right ear fevers chills weight gain fatigue.  ALLERGIES: Allergies  Allergen Reactions  . Penicillins Swelling  . Iodine Dermatitis     HOME MEDICATIONS: Outpatient Prescriptions Prior to Visit  Medication Sig Dispense Refill  . albuterol (PROVENTIL HFA;VENTOLIN HFA) 108 (90 BASE) MCG/ACT inhaler Inhale 2 puffs into the lungs every 6 (six) hours as needed for wheezing or shortness of breath. 1 Inhaler 5  . Alogliptin Benzoate (NESINA) 25 MG TABS Take 25 mg by mouth daily. 30 tablet 5  . amitriptyline (ELAVIL) 100 MG tablet Take 1 tablet (100 mg total) by mouth at bedtime. 30 tablet 5  . Beclomethasone Dipropionate (QNASL) 80 MCG/ACT AERS Place 1 spray into both nostrils 2 (two) times daily as needed (for allergies). 1 Inhaler 5  . budesonide-formoterol (SYMBICORT) 160-4.5 MCG/ACT inhaler Inhale 1 puff into the lungs 2 (two) times daily. 1 Inhaler 5  . cyclobenzaprine (AMRIX) 15 MG 24 hr capsule Take 1 capsule (15 mg total) by mouth daily. 30 capsule 5  . ibuprofen (ADVIL,MOTRIN) 800 MG tablet Take 1 tablet (800 mg total) by mouth 3 (three) times daily. 30 tablet 5  . levalbuterol (XOPENEX) 1.25 MG/3ML nebulizer solution Take 1.25 mg by nebulization every 4 (four) hours as needed for wheezing.    Marland Kitchen levocetirizine (XYZAL) 5 MG tablet Take 1 tablet (5 mg total) by mouth every evening. 30 tablet 5  . levothyroxine (SYNTHROID, LEVOTHROID) 125 MCG tablet Take 1 tablet (125 mcg total) by mouth daily before breakfast. 30 tablet 5  . montelukast (SINGULAIR) 10 MG tablet Take 1 tablet (10 mg total) by mouth at bedtime. 30 tablet 5  . omeprazole (PRILOSEC) 40 MG capsule Take 1 capsule (40 mg total) by mouth daily. 30 capsule 5  . simvastatin (ZOCOR) 40 MG tablet Take 1 tablet (40 mg total) by mouth daily. 30 tablet 5  . traZODone (DESYREL) 100  MG tablet Take 1 tab po daily at night for 7 days then 1/2 tab PO daily at night for 7 days, this is a taper. Then stop 30 tablet 0  . Gabapentin Enacarbil (HORIZANT) 600 MG TBCR Take 600 mg by mouth daily. May substitute with generic 30 tablet 5  . SUMAtriptan (IMITREX) 100 MG tablet Take 1  tablet po for migraine. May repeat in 2 hours if migraine persists or recurs. No more than 200 mg in 24 hour period 10 tablet 5  . Topiramate ER 50 MG CP24 Take 50 mg by mouth daily. 30 capsule 5  . Chlorzoxazone (LORZONE) 750 MG TABS Take 750 mg by mouth 3 (three) times daily.    . Ibuprofen-Famotidine 800-26.6 MG TABS Take by mouth.    . Moxifloxacin HCl (MOXEZA) 0.5 % SOLN Place 1 drop into both eyes daily as needed (for allergies).    Marland Kitchen olopatadine (PATANOL) 0.1 % ophthalmic solution 1 drop 2 (two) times daily.    . Vitamin D, Ergocalciferol, (DRISDOL) 50000 UNITS CAPS capsule Take 50,000 Units by mouth every 7 (seven) days.    Marland Kitchen HYDROcodone-homatropine (HYCODAN) 5-1.5 MG/5ML syrup Take 5 mLs by mouth at bedtime as needed. 60 mL 0   No facility-administered medications prior to visit.    PAST MEDICAL HISTORY: Past Medical History  Diagnosis Date  . Diabetes mellitus without complication   . Asthma   . 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 Thyroid disease   . Allergy   . Anxiety   . Arthritis   . Depression   . Meningioma   . Panic attacks     PAST SURGICAL HISTORY: Past Surgical History  Procedure Laterality Date  . Abdominal hysterectomy  2008    partial  . Thyroidectomy  2011    partial    FAMILY HISTORY: Family History  Problem Relation Age of Onset  . Thyroid disease Mother   . Diabetes Daughter   . Asthma Daughter     SOCIAL HISTORY:  Social History   Social History  . Marital Status: Single    Spouse Name: N/A  . Number of Children: 3  . Years of Education: 12   Occupational History  . disabled     since 2012, not worked since 2010   Social History Main Topics  . Smoking status: Never Smoker   . Smokeless tobacco: Never Used  . Alcohol Use: No     Comment: 03/01/15 "wine on occasion"  . Drug Use: 7.00 per week    Special: Marijuana     Comment: use 7x week , 03/01/15" marijuana almost daily"  . Sexual Activity: Not on file   Other  Topics Concern  . Not on file   Social History Narrative   Lives alone in home   03/01/15 No caffeine use at this time     PHYSICAL EXAM  GENERAL EXAM/CONSTITUTIONAL: Vitals:  Filed Vitals:   03/01/15 0740  BP: 130/82  Pulse: 76  Height: 5' 2.25" (1.581 m)  Weight: 198 lb (89.812 kg)     Body mass index is 35.93 kg/(m^2).  Visual Acuity Screening   Right eye Left eye Both eyes  Without correction: 20/20 20/20   With correction:        Patient is in no distress; well developed, nourished and groomed; neck is supple  CARDIOVASCULAR:  Examination of carotid arteries is normal; no carotid bruits  Regular rate and rhythm, no murmurs  Examination of  peripheral vascular system by observation and palpation is normal  EYES:  Ophthalmoscopic exam of optic discs and posterior segments is normal; no papilledema or hemorrhages  MUSCULOSKELETAL:  Gait, strength, tone, movements noted in Neurologic exam below  NEUROLOGIC: MENTAL STATUS:  No flowsheet data found.  awake, alert, oriented to person, place and time  recent and remote memory intact  normal attention and concentration  language fluent, comprehension intact, naming intact,   fund of knowledge appropriate  CRANIAL NERVE:   2nd - no papilledema on fundoscopic exam  2nd, 3rd, 4th, 6th - pupils equal and reactive to light, visual fields full to confrontation, extraocular muscles intact, no nystagmus  5th - facial sensation symmetric  7th - facial strength symmetric  8th - hearing intact  9th - palate elevates symmetrically, uvula midline  11th - shoulder shrug symmetric  12th - tongue protrusion midline  MOTOR:   normal bulk and tone, full strength in the BUE, BLE  SENSORY:   normal and symmetric to light touch, pinprick, temperature, vibration   COORDINATION:   finger-nose-finger, fine finger movements normal  REFLEXES:   deep tendon reflexes present and symmetric  GAIT/STATION:    narrow based gait; able to walk tandem; romberg is negative    DIAGNOSTIC DATA (LABS, IMAGING, TESTING) - I reviewed patient records, labs, notes, testing and imaging myself where available.  No results found for: WBC, HGB, HCT, MCV, PLT    Component Value Date/Time   NA 138 10/05/2014 0936   K 4.7 10/05/2014 0936   CL 104 10/05/2014 0936   CO2 27 10/05/2014 0936   GLUCOSE 86 10/05/2014 0936   BUN 7 10/05/2014 0936   CREATININE 0.97 10/05/2014 0936   CALCIUM 9.2 10/05/2014 0936   PROT 7.2 10/05/2014 0936   ALBUMIN 3.9 10/05/2014 0936   AST 22 10/05/2014 0936   ALT 20 10/05/2014 0936   ALKPHOS 50 10/05/2014 0936   BILITOT 0.2 10/05/2014 0936   Lab Results  Component Value Date   CHOL 148 10/05/2014   HDL 51 10/05/2014   LDLCALC 81 10/05/2014   TRIG 80 10/05/2014   CHOLHDL 2.9 10/05/2014   Lab Results  Component Value Date   HGBA1C 6.0 02/01/2015   No results found for: [id number] Lab Results  Component Value Date   TSH 1.308 10/05/2014       ASSESSMENT AND PLAN  49 y.o. year old female here with history of migraine with aura since age 70 years old. Now with increasing headaches over past 3-4 months. Also with history of meningioma in the past. Will repeat MRI scan of the brain. Will increase her topiramate up to 100 mg daily. Change sumatriptan to rizatriptan.    Dx: Migraine with aura and with status migrainosus, not intractable - Plan: MR Brain W Wo Contrast  Meningioma - Plan: MR Brain W Wo Contrast, Topiramate ER 50 MG CP24  Chronic migraine without aura without status migrainosus, not intractable - Plan: Topiramate ER 50 MG CP24    PLAN:  Orders Placed This Encounter  Procedures  . MR Brain W Wo Contrast   Meds ordered this encounter  Medications  . DISCONTD: Topiramate ER 50 MG CP24    Sig: Take 50 mg by mouth daily.    Dispense:  60 capsule    Refill:  12  . rizatriptan (MAXALT-MLT) 10 MG disintegrating tablet    Sig: Take 1 tablet (10  mg total) by mouth as needed for migraine. May repeat in 2 hours if  needed    Dispense:  9 tablet    Refill:  11  . Topiramate ER 50 MG CP24    Sig: Take 100 mg by mouth daily.    Dispense:  60 capsule    Refill:  12   Return in about 3 months (around 05/31/2015).    Penni Bombard, MD 08/25/2023, 4:27 AM Certified in Neurology, Neurophysiology and Neuroimaging  [facility] [address] [place], [place] [address] [phone]

## 2015-03-01 NOTE — Patient Instructions (Signed)
Increase topiramate ER to 100mg  daily.  Stop sumatriptan. Try rizatriptan instead.  I will check MRI brain.

## 2015-03-07 ENCOUNTER — Telehealth: Payer: Self-pay

## 2015-03-07 NOTE — Telephone Encounter (Addendum)
PA is needed for Amrix. I tried to have pharm run Rx through w/coding from the manufacturer, but pt has Medicare. Coupon does not work with Medicare, and can't be run through for cash pay in order to use coupon if she is eligible for Medicare. Dr Marin Comment, do you want to Rx just the regular cyclobenzaprine or another muscle relax that may be covered?  I ordered flexeril for her. Thanks

## 2015-03-11 ENCOUNTER — Other Ambulatory Visit: Payer: Self-pay

## 2015-03-11 MED ORDER — RIZATRIPTAN BENZOATE 10 MG PO TBDP: 10.0000 mg | ORAL_TABLET | ORAL | Status: DC | PRN

## 2015-03-14 MED ORDER — CYCLOBENZAPRINE HCL 10 MG PO TABS: 10.0000 mg | ORAL_TABLET | Freq: Two times a day (BID) | ORAL | Status: DC | PRN

## 2015-03-28 ENCOUNTER — Ambulatory Visit
Admission: RE | Admit: 2015-03-28 | Discharge: 2015-03-28 | Disposition: A | Discharge status: Home / Self Care | Payer: Commercial Managed Care - HMO | Source: Ambulatory Visit | Attending: Diagnostic Neuroimaging | Admitting: Diagnostic Neuroimaging

## 2015-03-28 DIAGNOSIS — G43101 Migraine with aura, not intractable, with status migrainosus: Secondary | ICD-10-CM | POA: Diagnosis not present

## 2015-03-28 DIAGNOSIS — D329 Benign neoplasm of meninges, unspecified: Secondary | ICD-10-CM

## 2015-03-28 MED ORDER — GADOBENATE DIMEGLUMINE 529 MG/ML IV SOLN
17.0000 mL | Freq: Once | INTRAVENOUS | Status: AC | PRN
  Administered 2015-03-28: 17 mL via INTRAVENOUS

## 2015-04-28 ENCOUNTER — Other Ambulatory Visit: Payer: Self-pay | Admitting: Family Medicine

## 2015-06-01 DIAGNOSIS — E039 Hypothyroidism, unspecified: Secondary | ICD-10-CM | POA: Diagnosis not present

## 2015-06-01 DIAGNOSIS — J45909 Unspecified asthma, uncomplicated: Secondary | ICD-10-CM | POA: Diagnosis not present

## 2015-06-01 DIAGNOSIS — Z0000 Encounter for general adult medical examination without abnormal findings: Secondary | ICD-10-CM | POA: Diagnosis not present

## 2015-06-01 DIAGNOSIS — E782 Mixed hyperlipidemia: Secondary | ICD-10-CM | POA: Diagnosis not present

## 2015-06-01 DIAGNOSIS — Z6833 Body mass index (BMI) 33.0-33.9, adult: Secondary | ICD-10-CM | POA: Diagnosis not present

## 2015-06-01 DIAGNOSIS — E1169 Type 2 diabetes mellitus with other specified complication: Secondary | ICD-10-CM | POA: Diagnosis not present

## 2015-06-01 DIAGNOSIS — E1140 Type 2 diabetes mellitus with diabetic neuropathy, unspecified: Secondary | ICD-10-CM | POA: Diagnosis not present

## 2015-06-01 DIAGNOSIS — E669 Obesity, unspecified: Secondary | ICD-10-CM | POA: Diagnosis not present

## 2015-06-04 ENCOUNTER — Other Ambulatory Visit: Payer: Self-pay | Admitting: Family Medicine

## 2015-06-07 ENCOUNTER — Ambulatory Visit: Payer: Commercial Managed Care - HMO | Admitting: Diagnostic Neuroimaging

## 2015-07-22 ENCOUNTER — Encounter: Payer: Self-pay | Admitting: Diagnostic Neuroimaging

## 2015-07-22 ENCOUNTER — Ambulatory Visit (INDEPENDENT_AMBULATORY_CARE_PROVIDER_SITE_OTHER): Payer: Commercial Managed Care - HMO | Admitting: Diagnostic Neuroimaging

## 2015-07-22 VITALS — BP 133/82 | HR 87 | Ht 62.25 in | Wt 216.2 lb

## 2015-07-22 DIAGNOSIS — D329 Benign neoplasm of meninges, unspecified: Secondary | ICD-10-CM | POA: Diagnosis not present

## 2015-07-22 DIAGNOSIS — G43101 Migraine with aura, not intractable, with status migrainosus: Secondary | ICD-10-CM

## 2015-07-22 MED ORDER — TOPIRAMATE 50 MG PO TABS: 50.0000 mg | ORAL_TABLET | Freq: Two times a day (BID) | ORAL | Status: DC

## 2015-07-22 MED ORDER — RIZATRIPTAN BENZOATE 10 MG PO TBDP: 10.0000 mg | ORAL_TABLET | ORAL | Status: AC | PRN

## 2015-07-22 NOTE — Patient Instructions (Signed)
-   repeat MRI brain in 1 year  - restart topiramate 50mg  twice a day - continue rizatriptan as needed  - consider migraine research study (observational)

## 2015-07-22 NOTE — Progress Notes (Signed)
[facility]  PATIENT: Misty Franco DOB: June 01, 1966  REFERRING CLINICIAN: T Le  HISTORY FROM: patient  REASON FOR VISIT: follow up     HISTORICAL  CHIEF COMPLAINT:  Chief Complaint  Patient presents with  . Migraine    rm 7, "migraines 1-2/wk, Ibuprofen, Maxalt, ice for HA- not very helpful, stress brings them on; cannot afford Trokendi or Duexis"  . Follow-up    3 month    HISTORY OF PRESENT ILLNESS:   UPDATE 07/22/15: Since last visit, tried trokendi, which helped, but then ins did not pay for med. Rizatriptan helping better than sumatriptan. HA are 1-2 per week. Usually triggered by stress, sunlight or neck pain. Has been off trokendi since Oct 2016.   PRIOR HPI (03/01/15): 50 year old right-handed female here for evaluation of migraines and meningioma. Patient has history of diabetes, hypercholesteremia, depression, anxiety, muscle spasm, anxiety, panic attack, asthma. Patient reports history of migraine headaches since age 77 years old. She describes pounding, exploding sensation on front, back, global or bitemporal regions. Sometimes headaches are unilateral. These are associated at times with nausea, vomiting, photophobia, phonophobia. Sometimes she sees stars and sparkles before the headache. Sometimes she feels dizziness and presyncope symptoms leading up to the headaches. Triggering factors include stress, sunlight, asthma, asthma inhalers. Patient has been treated with topiramate, gabapentin, sumatriptan, with mild relief in the past. Patient was having 1-3 days of headache per week. In the past 3 months she has been having 6-7 days of headache per week. Sometimes headaches last up to 2-3 weeks that time. Patient moved here from [place] and November 2015. Patient now try to establish medical care locally.   REVIEW OF SYSTEMS: Full 14 system review of systems performed and notable only for insomnia snoring headache numbness weakness dizziness depression  anxiety decreased energy hallucinations allergy skin sensitivity joint pain joint swelling aching muscles feeling hot and cold increased thirst flushing a bruising blurred vision double vision shortness of breath wheezing snoring swelling in legs hearing loss in the right ear fevers chills weight gain fatigue.   ALLERGIES: Allergies  Allergen Reactions  . Penicillins Swelling  . Iodine Dermatitis    HOME MEDICATIONS: Outpatient Prescriptions Prior to Visit  Medication Sig Dispense Refill  . albuterol (PROVENTIL HFA;VENTOLIN HFA) 108 (90 BASE) MCG/ACT inhaler Inhale 2 puffs into the lungs every 6 (six) hours as needed for wheezing or shortness of breath. 1 Inhaler 5  . Alogliptin Benzoate (NESINA) 25 MG TABS Take 25 mg by mouth daily. 30 tablet 5  . amitriptyline (ELAVIL) 100 MG tablet Take 1 tablet (100 mg total) by mouth at bedtime. 30 tablet 5  . Beclomethasone Dipropionate (QNASL) 80 MCG/ACT AERS Place 1 spray into both nostrils 2 (two) times daily as needed (for allergies). 1 Inhaler 5  . budesonide-formoterol (SYMBICORT) 160-4.5 MCG/ACT inhaler Inhale 1 puff into the lungs 2 (two) times daily. 1 Inhaler 5  . Chlorzoxazone (LORZONE) 750 MG TABS Take 750 mg by mouth 3 (three) times daily.    . cyclobenzaprine (FLEXERIL) 10 MG tablet TAKE 1 TABLET BY MOUTH TWICE DAILY AS NEEDED FOR MUSCLE SPASM 60 tablet 1  . gabapentin (NEURONTIN) 600 MG tablet     . ibuprofen (ADVIL,MOTRIN) 800 MG tablet Take 1 tablet (800 mg total) by mouth 3 (three) times daily. 30 tablet 5  . Ibuprofen-Famotidine 800-26.6 MG TABS Take by mouth.    . levalbuterol (XOPENEX) 1.25 MG/3ML nebulizer solution Take 1.25 mg by nebulization every 4 (four) hours as  needed for wheezing.    Marland Kitchen levocetirizine (XYZAL) 5 MG tablet Take 1 tablet (5 mg total) by mouth every evening. 30 tablet 5  . levothyroxine (SYNTHROID, LEVOTHROID) 125 MCG tablet Take 1 tablet (125 mcg total) by mouth daily before breakfast. 30 tablet 5  .  montelukast (SINGULAIR) 10 MG tablet Take 1 tablet (10 mg total) by mouth at bedtime. 30 tablet 5  . Moxifloxacin HCl (MOXEZA) 0.5 % SOLN Place 1 drop into both eyes daily as needed (for allergies).    Marland Kitchen olopatadine (PATANOL) 0.1 % ophthalmic solution 1 drop 2 (two) times daily.    Marland Kitchen omeprazole (PRILOSEC) 40 MG capsule Take 1 capsule (40 mg total) by mouth daily. 30 capsule 5  . rizatriptan (MAXALT-MLT) 10 MG disintegrating tablet Take 1 tablet (10 mg total) by mouth as needed for migraine. May repeat in 2 hours if needed 27 tablet 3  . simvastatin (ZOCOR) 40 MG tablet Take 1 tablet (40 mg total) by mouth daily. 30 tablet 5  . traZODone (DESYREL) 100 MG tablet Take 1 tab po daily at night for 7 days then 1/2 tab PO daily at night for 7 days, this is a taper. Then stop 30 tablet 0  . Vitamin D, Ergocalciferol, (DRISDOL) 50000 UNITS CAPS capsule Take 50,000 Units by mouth every 7 (seven) days.    . Topiramate ER 50 MG CP24 Take 100 mg by mouth daily. 60 capsule 12   No facility-administered medications prior to visit.    PAST MEDICAL HISTORY: Past Medical History  Diagnosis Date  . Diabetes mellitus without complication ([place])   . Asthma   . 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 Thyroid disease   . Allergy   . Anxiety   . Arthritis   . Depression   . Meningioma ([place])   . Panic attacks     PAST SURGICAL HISTORY: Past Surgical History  Procedure Laterality Date  . Abdominal hysterectomy  2008    partial  . Thyroidectomy  2011    partial    FAMILY HISTORY: Family History  Problem Relation Age of Onset  . Thyroid disease Mother   . Diabetes Daughter   . Asthma Daughter     SOCIAL HISTORY:  Social History   Social History  . Marital Status: Single    Spouse Name: N/A  . Number of Children: 3  . Years of Education: 12   Occupational History  . disabled     since 2012, not worked since 2010   Social History Main Topics  . Smoking status: Never Smoker   .  Smokeless tobacco: Never Used  . Alcohol Use: No     Comment: 03/01/15 "wine on occasion"  . Drug Use: 7.00 per week    Special: Marijuana     Comment: use 7x week , 03/01/15" marijuana almost daily"  . Sexual Activity: Not on file   Other Topics Concern  . Not on file   Social History Narrative   Lives alone in home   03/01/15 No caffeine use at this time     PHYSICAL EXAM  GENERAL EXAM/CONSTITUTIONAL: Vitals:  Filed Vitals:   07/22/15 0811  BP: 133/82  Pulse: 87  Height: 5' 2.25" (1.581 m)  Weight: 216 lb 3.2 oz (98.068 kg)   Body mass index is 39.23 kg/(m^2). No exam data present  Patient is in no distress; well developed, nourished and groomed; neck is supple  CARDIOVASCULAR:  Examination of carotid arteries  is normal; no carotid bruits  Regular rate and rhythm, no murmurs  Examination of peripheral vascular system by observation and palpation is normal  EYES:  Ophthalmoscopic exam of optic discs and posterior segments is normal; no papilledema or hemorrhages  MUSCULOSKELETAL:  Gait, strength, tone, movements noted in Neurologic exam below  NEUROLOGIC: MENTAL STATUS:  No flowsheet data found.  awake, alert, oriented to person, place and time  recent and remote memory intact  normal attention and concentration  language fluent, comprehension intact, naming intact,   fund of knowledge appropriate  CRANIAL NERVE:   2nd - no papilledema on fundoscopic exam  2nd, 3rd, 4th, 6th - pupils equal and reactive to light, visual fields full to confrontation, extraocular muscles intact, no nystagmus  5th - facial sensation symmetric  7th - facial strength symmetric  8th - hearing intact  9th - palate elevates symmetrically, uvula midline  11th - shoulder shrug symmetric  12th - tongue protrusion midline  MOTOR:   normal bulk and tone, full strength in the BUE, BLE  SENSORY:   normal and symmetric to light touch, temperature, vibration    COORDINATION:   finger-nose-finger, fine finger movements normal  REFLEXES:   deep tendon reflexes present and symmetric  GAIT/STATION:   narrow based gait; able to walk tandem; romberg is negative    DIAGNOSTIC DATA (LABS, IMAGING, TESTING) - I reviewed patient records, labs, notes, testing and imaging myself where available.  No results found for: WBC, HGB, HCT, MCV, PLT    Component Value Date/Time   NA 138 10/05/2014 0936   K 4.7 10/05/2014 0936   CL 104 10/05/2014 0936   CO2 27 10/05/2014 0936   GLUCOSE 86 10/05/2014 0936   BUN 7 10/05/2014 0936   CREATININE 0.97 10/05/2014 0936   CALCIUM 9.2 10/05/2014 0936   PROT 7.2 10/05/2014 0936   ALBUMIN 3.9 10/05/2014 0936   AST 22 10/05/2014 0936   ALT 20 10/05/2014 0936   ALKPHOS 50 10/05/2014 0936   BILITOT 0.2 10/05/2014 0936   Lab Results  Component Value Date   CHOL 148 10/05/2014   HDL 51 10/05/2014   LDLCALC 81 10/05/2014   TRIG 80 10/05/2014   CHOLHDL 2.9 10/05/2014   Lab Results  Component Value Date   HGBA1C 6.0 02/01/2015   No results found for: [id number] Lab Results  Component Value Date   TSH 1.308 10/05/2014       ASSESSMENT AND PLAN  50 y.o. year old female here with history of migraine with aura since age 69 years old. Now with increasing headaches since June 2016. MRI brain shows meningioma, possibly slightly increased in size compared to 3 yrs ago per patient.  Trokendi helped, but insurance did not cover. Will restart regular topiramate. Continue rizatriptan as needed.   Dx:  Migraine with aura and with status migrainosus, not intractable  Meningioma ([facility])    PLAN: - repeat MRI brain in 1 year - restart topiramate 50mg  BID - continue rizatriptan as needed - consider migraine research study (observational)  Meds ordered this encounter  Medications  . topiramate (TOPAMAX) 50 MG tablet    Sig: Take 1 tablet (50 mg total) by mouth 2 (two) times daily.    Dispense:  60  tablet    Refill:  12  . rizatriptan (MAXALT-MLT) 10 MG disintegrating tablet    Sig: Take 1 tablet (10 mg total) by mouth as needed for migraine. May repeat in 2 hours if needed    Dispense:  27 tablet    Refill:  3   No Follow-up on file.    Penni Bombard, MD [id number], [id number] AM Certified in Neurology, Neurophysiology and Neuroimaging  [facility] [address], [place], [place] [address] [phone]

## 2015-07-25 ENCOUNTER — Telehealth: Payer: Self-pay | Admitting: Diagnostic Neuroimaging

## 2015-07-25 NOTE — Telephone Encounter (Signed)
I spoke with Misty Franco.  I told her that my reason for the call was to schedule her for a research study.  We scheduled an appointment for Friday, Feb. 10th at 9:30am.

## 2015-08-01 ENCOUNTER — Other Ambulatory Visit: Payer: Self-pay | Admitting: Family Medicine

## 2015-08-01 ENCOUNTER — Ambulatory Visit
Admission: RE | Admit: 2015-08-01 | Discharge: 2015-08-01 | Disposition: A | Discharge status: Home / Self Care | Payer: Commercial Managed Care - HMO | Source: Ambulatory Visit | Attending: Diagnostic Neuroimaging | Admitting: Diagnostic Neuroimaging

## 2015-08-01 DIAGNOSIS — D329 Benign neoplasm of meninges, unspecified: Secondary | ICD-10-CM

## 2015-08-01 MED ORDER — GADOBENATE DIMEGLUMINE 529 MG/ML IV SOLN
20.0000 mL | Freq: Once | INTRAVENOUS | Status: AC | PRN
  Administered 2015-08-01: 20 mL via INTRAVENOUS

## 2015-08-05 ENCOUNTER — Encounter (INDEPENDENT_AMBULATORY_CARE_PROVIDER_SITE_OTHER): Payer: Self-pay

## 2015-08-05 ENCOUNTER — Telehealth: Payer: Self-pay

## 2015-08-05 DIAGNOSIS — Z0289 Encounter for other administrative examinations: Secondary | ICD-10-CM

## 2015-08-05 NOTE — Telephone Encounter (Signed)
PA needed for generic nesina taken for DM. Started on covermymeds but need to get hx of other DM meds tried/failed in past. We do not have any other meds in our records. LMOM for CB

## 2015-08-08 ENCOUNTER — Telehealth: Payer: Self-pay | Admitting: Diagnostic Neuroimaging

## 2015-08-08 DIAGNOSIS — R937 Abnormal findings on diagnostic imaging of other parts of musculoskeletal system: Secondary | ICD-10-CM

## 2015-08-08 DIAGNOSIS — R930 Abnormal findings on diagnostic imaging of skull and head, not elsewhere classified: Secondary | ICD-10-CM

## 2015-08-08 NOTE — Telephone Encounter (Signed)
i callled patient and reviewed results. MRI brain looks stable. However there is small spot on c-spine, possible syrinx, which I will follow up with MRI cervical spine. -VRP

## 2015-08-10 ENCOUNTER — Telehealth: Payer: Self-pay | Admitting: *Deleted

## 2015-08-10 ENCOUNTER — Telehealth: Payer: Self-pay | Admitting: Diagnostic Neuroimaging

## 2015-08-10 NOTE — Telephone Encounter (Signed)
PA was denied because pt has not tried/failed preferred alternatives: Tradjenta, Januvia and Onglyza. Dr Marin Comment, do you want to change pt to one of these?

## 2015-08-10 NOTE — Telephone Encounter (Signed)
Marlynn A. Orman called.  She stated that her ClinCard for the [facility] study was not activated.  She found this out after calling the customer service number for [employer].  The customer service person instructed her to call us.  I told her that I would let Rizwan know about the situation and that I would call her back once her card was activated.

## 2015-08-10 NOTE — Telephone Encounter (Signed)
PA started with Lenis Dickinson, [phone]. PA request form faxed to [facility] for Rizatriptan.

## 2015-08-10 NOTE — Telephone Encounter (Signed)
Pt called back and reported that she had tried metformin in the past, but it was not effective for her. She has been taking Nesina for about 2 years with good effectiveness. Completed PA. Pending.

## 2015-08-11 ENCOUNTER — Other Ambulatory Visit: Payer: Self-pay | Admitting: Family Medicine

## 2015-08-11 NOTE — Telephone Encounter (Signed)
Left message that her PA for Alogliptan/Nesina was not approved, asked if she has used Januvia before which is in the same DPP IV class . If so then would add that to the metformin. She should be on metformin and also [place]. Also she neds to come in for a recheck if she has not established care with a PCP in the next 1-2 months, especially if she has not been on metformin or nesina.

## 2015-08-11 NOTE — Telephone Encounter (Signed)
Gave patient dr Marin Comment message and pt states she has only took metformin and nesina nothing else   Please call back

## 2015-08-12 ENCOUNTER — Telehealth: Payer: Self-pay

## 2015-08-12 ENCOUNTER — Telehealth: Payer: Self-pay | Admitting: Family Medicine

## 2015-08-12 NOTE — Telephone Encounter (Signed)
Msg is for Dr. Marin Comment, pt wants to speak to her personally.  Please advise  [phone]

## 2015-08-12 NOTE — Telephone Encounter (Signed)
Left message to tell me dose and frequency of Meetformin, also we need to changes NEsina to Januvia. She neds to let me know if she is ok withthis to make appropriate adjustment to pharmacy.

## 2015-08-13 NOTE — Telephone Encounter (Signed)
Dr. Le  Please see previous message 

## 2015-08-17 MED ORDER — SITAGLIPTIN PHOS-METFORMIN HCL 50-500 MG PO TABS: 1.0000 | ORAL_TABLET | Freq: Two times a day (BID) | ORAL | Status: DC

## 2015-08-17 NOTE — Telephone Encounter (Signed)
Spoke with patient about meds, she has not been on metformin in a while maybe last time was in 2009/2011. Did nto work.  Last a1c was 5.2 on Nesina. Will go ahead and put her on low dose Metformin and also Januvia  To see if A1C well controlled. Will come back for labs in 1-2 months.

## 2015-08-18 ENCOUNTER — Other Ambulatory Visit: Payer: Commercial Managed Care - HMO

## 2015-08-29 ENCOUNTER — Encounter: Payer: Self-pay | Admitting: *Deleted

## 2015-08-29 NOTE — Progress Notes (Signed)
Received letter from [facility] stating patient does not need PA for Rizatriptan 10 mg ODT. It is available at amount listed in her plan at any pharmacy that participates with [facility].

## 2015-08-30 ENCOUNTER — Ambulatory Visit
Admission: RE | Admit: 2015-08-30 | Discharge: 2015-08-30 | Disposition: A | Discharge status: Home / Self Care | Payer: Commercial Managed Care - HMO | Source: Ambulatory Visit | Attending: Diagnostic Neuroimaging | Admitting: Diagnostic Neuroimaging

## 2015-08-30 DIAGNOSIS — R937 Abnormal findings on diagnostic imaging of other parts of musculoskeletal system: Secondary | ICD-10-CM

## 2015-08-30 DIAGNOSIS — M4802 Spinal stenosis, cervical region: Secondary | ICD-10-CM | POA: Diagnosis not present

## 2015-08-30 DIAGNOSIS — R930 Abnormal findings on diagnostic imaging of skull and head, not elsewhere classified: Secondary | ICD-10-CM

## 2015-08-30 MED ORDER — GADOBENATE DIMEGLUMINE 529 MG/ML IV SOLN
20.0000 mL | Freq: Once | INTRAVENOUS | Status: AC | PRN
  Administered 2015-08-30: 19 mL via INTRAVENOUS

## 2015-08-31 ENCOUNTER — Ambulatory Visit (INDEPENDENT_AMBULATORY_CARE_PROVIDER_SITE_OTHER): Payer: Commercial Managed Care - HMO | Admitting: Family Medicine

## 2015-08-31 VITALS — BP 120/80 | HR 81 | Temp 98.8°F | Resp 17 | Ht 62.5 in | Wt 210.0 lb

## 2015-08-31 DIAGNOSIS — T161XXA Foreign body in right ear, initial encounter: Secondary | ICD-10-CM | POA: Diagnosis not present

## 2015-08-31 DIAGNOSIS — Z1159 Encounter for screening for other viral diseases: Secondary | ICD-10-CM | POA: Diagnosis not present

## 2015-08-31 DIAGNOSIS — M1990 Unspecified osteoarthritis, unspecified site: Secondary | ICD-10-CM

## 2015-08-31 DIAGNOSIS — J3089 Other allergic rhinitis: Secondary | ICD-10-CM

## 2015-08-31 DIAGNOSIS — M1712 Unilateral primary osteoarthritis, left knee: Secondary | ICD-10-CM

## 2015-08-31 DIAGNOSIS — E119 Type 2 diabetes mellitus without complications: Secondary | ICD-10-CM

## 2015-08-31 DIAGNOSIS — G43709 Chronic migraine without aura, not intractable, without status migrainosus: Secondary | ICD-10-CM

## 2015-08-31 DIAGNOSIS — D329 Benign neoplasm of meninges, unspecified: Secondary | ICD-10-CM

## 2015-08-31 DIAGNOSIS — E890 Postprocedural hypothyroidism: Secondary | ICD-10-CM

## 2015-08-31 DIAGNOSIS — J4540 Moderate persistent asthma, uncomplicated: Secondary | ICD-10-CM | POA: Diagnosis not present

## 2015-08-31 DIAGNOSIS — Z114 Encounter for screening for human immunodeficiency virus [HIV]: Secondary | ICD-10-CM | POA: Diagnosis not present

## 2015-08-31 DIAGNOSIS — E785 Hyperlipidemia, unspecified: Secondary | ICD-10-CM | POA: Diagnosis not present

## 2015-08-31 DIAGNOSIS — R6889 Other general symptoms and signs: Secondary | ICD-10-CM | POA: Diagnosis not present

## 2015-08-31 DIAGNOSIS — G43101 Migraine with aura, not intractable, with status migrainosus: Secondary | ICD-10-CM

## 2015-08-31 DIAGNOSIS — S00451A Superficial foreign body of right ear, initial encounter: Secondary | ICD-10-CM

## 2015-08-31 DIAGNOSIS — J45909 Unspecified asthma, uncomplicated: Secondary | ICD-10-CM

## 2015-08-31 LAB — POCT CBC
GRANULOCYTE PERCENT: 57.5 % (ref 37–80)
HEMATOCRIT: 35.6 % — AB (ref 37.7–47.9)
HEMOGLOBIN: 12.8 g/dL (ref 12.2–16.2)
LYMPH, POC: 2.2 (ref 0.6–3.4)
MCH, POC: 34.3 pg — AB (ref 27–31.2)
MCHC: 36 g/dL — AB (ref 31.8–35.4)
MCV: 95.3 fL (ref 80–97)
MID (cbc): 0.5 (ref 0–0.9)
MPV: 8.1 fL (ref 0–99.8)
POC GRANULOCYTE: 3.7 (ref 2–6.9)
POC LYMPH %: 34.6 % (ref 10–50)
POC MID %: 7.9 % (ref 0–12)
Platelet Count, POC: 320 10*3/uL (ref 142–424)
RBC: 3.73 M/uL — AB (ref 4.04–5.48)
RDW, POC: 14.4 %
WBC: 6.4 10*3/uL (ref 4.6–10.2)

## 2015-08-31 LAB — COMPREHENSIVE METABOLIC PANEL
ALBUMIN: 3.8 g/dL (ref 3.6–5.1)
ALK PHOS: 58 U/L (ref 33–115)
ALT: 20 U/L (ref 6–29)
AST: 22 U/L (ref 10–35)
BUN: 6 mg/dL — AB (ref 7–25)
CO2: 22 mmol/L (ref 20–31)
CREATININE: 1.05 mg/dL (ref 0.50–1.10)
Calcium: 8.8 mg/dL (ref 8.6–10.2)
Chloride: 107 mmol/L (ref 98–110)
Glucose, Bld: 98 mg/dL (ref 65–99)
Potassium: 3.8 mmol/L (ref 3.5–5.3)
SODIUM: 138 mmol/L (ref 135–146)
TOTAL PROTEIN: 7 g/dL (ref 6.1–8.1)
Total Bilirubin: 0.2 mg/dL (ref 0.2–1.2)

## 2015-08-31 LAB — LIPID PANEL
CHOLESTEROL: 152 mg/dL (ref 125–200)
HDL: 36 mg/dL — ABNORMAL LOW (ref >=46)
LDL Cholesterol: 95 mg/dL (ref <130)
Total CHOL/HDL Ratio: 4.2 Ratio (ref <=5.0)
Triglycerides: 107 mg/dL (ref <150)
VLDL: 21 mg/dL (ref <30)

## 2015-08-31 LAB — POCT GLYCOSYLATED HEMOGLOBIN (HGB A1C): Hemoglobin A1C: 6

## 2015-08-31 LAB — POCT INFLUENZA A/B
INFLUENZA B, POC: NEGATIVE
Influenza A, POC: NEGATIVE

## 2015-08-31 LAB — HIV ANTIBODY (ROUTINE TESTING W REFLEX): HIV 1&2 Ab, 4th Generation: NONREACTIVE

## 2015-08-31 LAB — GLUCOSE, POCT (MANUAL RESULT ENTRY): POC Glucose: 112 mg/dl — AB (ref 70–99)

## 2015-08-31 MED ORDER — SIMVASTATIN 40 MG PO TABS: 40.0000 mg | ORAL_TABLET | Freq: Every day | ORAL | Status: DC

## 2015-08-31 MED ORDER — OSELTAMIVIR PHOSPHATE 75 MG PO CAPS: 75.0000 mg | ORAL_CAPSULE | Freq: Two times a day (BID) | ORAL | Status: DC

## 2015-08-31 MED ORDER — ALBUTEROL SULFATE HFA 108 (90 BASE) MCG/ACT IN AERS: 2.0000 | INHALATION_SPRAY | Freq: Four times a day (QID) | RESPIRATORY_TRACT | Status: DC | PRN

## 2015-08-31 MED ORDER — LEVOCETIRIZINE DIHYDROCHLORIDE 5 MG PO TABS: 5.0000 mg | ORAL_TABLET | Freq: Every evening | ORAL | Status: AC

## 2015-08-31 MED ORDER — OMEPRAZOLE 40 MG PO CPDR: 40.0000 mg | DELAYED_RELEASE_CAPSULE | Freq: Every day | ORAL | Status: DC

## 2015-08-31 MED ORDER — FLUOXETINE HCL 20 MG PO TABS: 20.0000 mg | ORAL_TABLET | Freq: Every day | ORAL | Status: DC

## 2015-08-31 MED ORDER — PREDNISONE 20 MG PO TABS: ORAL_TABLET | ORAL | Status: AC

## 2015-08-31 MED ORDER — BECLOMETHASONE DIPROPIONATE 80 MCG/ACT NA AERS: 1.0000 | INHALATION_SPRAY | Freq: Two times a day (BID) | NASAL | Status: AC | PRN

## 2015-08-31 MED ORDER — MONTELUKAST SODIUM 10 MG PO TABS: 10.0000 mg | ORAL_TABLET | Freq: Every day | ORAL | Status: AC

## 2015-08-31 MED ORDER — MUCINEX DM 30-600 MG PO TB12: 1.0000 | ORAL_TABLET | Freq: Two times a day (BID) | ORAL | Status: DC

## 2015-08-31 MED ORDER — LEVOTHYROXINE SODIUM 125 MCG PO TABS: 125.0000 ug | ORAL_TABLET | Freq: Every day | ORAL | Status: DC

## 2015-08-31 MED ORDER — METFORMIN HCL 500 MG PO TABS: 500.0000 mg | ORAL_TABLET | Freq: Two times a day (BID) | ORAL | Status: DC

## 2015-08-31 MED ORDER — BUDESONIDE-FORMOTEROL FUMARATE 160-4.5 MCG/ACT IN AERO: 2.0000 | INHALATION_SPRAY | Freq: Two times a day (BID) | RESPIRATORY_TRACT | Status: DC

## 2015-08-31 NOTE — Progress Notes (Signed)
Subjective:    Patient ID: Misty Franco, female    DOB: July 30, 1965, 50 y.o.   MRN: [id number]  08/31/2015  Medication Refill; Cough; and Depression   HPI This 50 y.o. female presents for three week illness.  Onset three weeks ago and then improved and has recently worsened.  Got ill on Christmas Eve; then got sick again two weeks later.  Asthma has worsened.  +fever in past week; +chills/sweats.  +body aches.  Chronic daily headaches so has not noticed worsening headaches.  +ST; +R ear pain; recurrent swimmers ear.  +rhinorrhea; +throat scratchy and itchy; intermittent pain with swallowing.  +coughing; +sputum brown yellow clear.  +SOB; +wheezing.  S/p flu vaccine in December 2016 via [facility] with healthcare Lucianne Lei.  Benadryl, Nyquil, generic mucinex, ginger tea.  Out of Albuterol two weeks ago; taking primateen mist pills; Out of Symbicort one month ago.   Allergic Rhinitis: taking Qnasal, Xyzal, Singulair.    Asthma: out of Symbicort and Albuterol for the past month; no recent hospitalizations; last hospitalization age 58.  Moved to [place] 2015; no ED evaluations. From [place].  Migraines:  Amitriptyline, Flexeril, Gabapentin, Topamax, and Maxalt.  Insomnia: Trazodone.  Hypothyroidism: Patient reports good compliance with medication, good tolerance to medication, and good symptom control.  Dx 2011.  DMII: Patient reports good compliance with medication, good tolerance to medication, and good symptom control.  Sugars running 98-122 fasting.  Janumet ---never started medication.  Last visit with Marin Comment 01/2015.  Hypercholesterolemia:  Patient reports good compliance with medication, good tolerance to medication, and good symptom control.    GERD: Patient reports good compliance with medication, good tolerance to medication, and good symptom control.  Omeprazole.   Anxiety and depression: has not worked until 2010.  Received disability in 2010.  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    Review of Systems    Constitutional: Negative for fever, chills, diaphoresis and fatigue.  Eyes: Negative for visual disturbance.  Respiratory: Negative for cough and shortness of breath.   Cardiovascular: Negative for chest pain, palpitations and leg swelling.  Gastrointestinal: Negative for nausea, vomiting, abdominal pain, diarrhea and constipation.  Endocrine: Negative for cold intolerance, heat intolerance, polydipsia, polyphagia and polyuria.  Neurological: Negative for dizziness, tremors, seizures, syncope, facial asymmetry, speech difficulty, weakness, light-headedness, numbness and headaches.    Past Medical History  Diagnosis Date  . Diabetes mellitus without complication ([place])   . Asthma   . 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 Thyroid disease   . Allergy   . Anxiety   . Arthritis   . Depression   . Meningioma ([place])   . Panic attacks    Past Surgical History  Procedure Laterality Date  . Abdominal hysterectomy  2008    partial  . Thyroidectomy  2011    partial   Allergies  Allergen Reactions  . Penicillins Swelling  . Iodine Dermatitis    Social History   Social History  . Marital Status: Single    Spouse Name: N/A  . Number of Children: 3  . Years of Education: 12   Occupational History  . disabled     since 2012, not worked since 2010   Social History Main Topics  . Smoking status: Never Smoker   . Smokeless tobacco: Never Used  . Alcohol Use: No     Comment: 03/01/15 "wine on occasion"  . Drug Use: 7.00 per week    Special: Marijuana     Comment:  use 7x week , 03/01/15" marijuana almost daily"  . Sexual Activity: Not on file   Other Topics Concern  . Not on file   Social History Narrative   Lives alone in home   03/01/15 No caffeine use at this time   Family History  Problem Relation Age of Onset  . Thyroid disease Mother   . Diabetes Daughter   . Asthma Daughter        Objective:    BP 120/80 mmHg  Pulse 81  Temp(Src) 98.8 F (37.1 C) (Oral)   Resp 17  Ht 5' 2.5" (1.588 m)  Wt 210 lb (95.255 kg)  BMI 37.77 kg/m2  SpO2 95% Physical Exam  Constitutional: She is oriented to person, place, and time. She appears well-developed and well-nourished. No distress.  HENT:  Head: Normocephalic and atraumatic.  Right Ear: External ear normal.  Left Ear: External ear normal.  Nose: Nose normal.  Mouth/Throat: Oropharynx is clear and moist.  Eyes: Conjunctivae and EOM are normal. Pupils are equal, round, and reactive to light.  Neck: Normal range of motion. Neck supple. Carotid bruit is not present. No thyromegaly present.  Cardiovascular: Normal rate, regular rhythm, normal heart sounds and intact distal pulses.  Exam reveals no gallop and no friction rub.   No murmur heard. Pulmonary/Chest: Effort normal and breath sounds normal. She has no wheezes. She has no rales.  Abdominal: Soft. Bowel sounds are normal. She exhibits no distension and no mass. There is no tenderness. There is no rebound and no guarding.  Lymphadenopathy:    She has no cervical adenopathy.  Neurological: She is alert and oriented to person, place, and time. No cranial nerve deficit.  Skin: Skin is warm and dry. No rash noted. She is not diaphoretic. No erythema. No pallor.  Psychiatric: She has a normal mood and affect. Her behavior is normal.        Assessment & Plan:   1. Type 2 diabetes mellitus without complication, without long-term current use of insulin ([place])   2. Postoperative hypothyroidism   3. Meningioma ([place])   4. Environmental and seasonal allergies   5. Chronic migraine without aura without status migrainosus, not intractable   6. Dyslipidemia   7. Asthma, chronic, moderate persistent, uncomplicated   8. Migraine with aura and with status migrainosus, not intractable   9. Flu-like symptoms   10. Screening for HIV (human immunodeficiency virus)   11. Asthma, chronic, unspecified asthma severity, uncomplicated   12. Arthritis   13. Primary  osteoarthritis of left knee   14. Foreign body in ear lobe, right, initial encounter     Orders Placed This Encounter  Procedures  . Comprehensive metabolic panel    Order Specific Question:  Has the patient fasted?    Answer:  Yes  . Lipid panel    Order Specific Question:  Has the patient fasted?    Answer:  Yes  . HIV antibody  . Ear wax removal  . POCT CBC  . POCT glucose (manual entry)  . POCT glycosylated hemoglobin (Hb A1C)  . POCT Influenza A/B   Meds ordered this encounter  Medications  . oseltamivir (TAMIFLU) 75 MG capsule    Sig: Take 1 capsule (75 mg total) by mouth 2 (two) times daily.    Dispense:  10 capsule    Refill:  0  . predniSONE (DELTASONE) 20 MG tablet    Sig: Two tablets daily x 5 days then one tablet daily x 5 days  Dispense:  15 tablet    Refill:  0  . Dextromethorphan-Guaifenesin (MUCINEX DM) 30-600 MG TB12    Sig: Take 1 tablet by mouth 2 (two) times daily.    Dispense:  28 each    Refill:  0  . simvastatin (ZOCOR) 40 MG tablet    Sig: Take 1 tablet (40 mg total) by mouth daily.    Dispense:  90 tablet    Refill:  1  . omeprazole (PRILOSEC) 40 MG capsule    Sig: Take 1 capsule (40 mg total) by mouth daily.    Dispense:  90 capsule    Refill:  3  . montelukast (SINGULAIR) 10 MG tablet    Sig: Take 1 tablet (10 mg total) by mouth at bedtime.    Dispense:  90 tablet    Refill:  3  . levothyroxine (SYNTHROID, LEVOTHROID) 125 MCG tablet    Sig: Take 1 tablet (125 mcg total) by mouth daily before breakfast.    Dispense:  90 tablet    Refill:  1  . levocetirizine (XYZAL) 5 MG tablet    Sig: Take 1 tablet (5 mg total) by mouth every evening.    Dispense:  90 tablet    Refill:  3  . budesonide-formoterol (SYMBICORT) 160-4.5 MCG/ACT inhaler    Sig: Inhale 2 puffs into the lungs 2 (two) times daily.    Dispense:  1 Inhaler    Refill:  5  . Beclomethasone Dipropionate (QNASL) 80 MCG/ACT AERS    Sig: Place 1 spray into both nostrils 2 (two)  times daily as needed (for allergies).    Dispense:  1 Inhaler    Refill:  6  . albuterol (PROVENTIL HFA;VENTOLIN HFA) 108 (90 Base) MCG/ACT inhaler    Sig: Inhale 2 puffs into the lungs every 6 (six) hours as needed for wheezing or shortness of breath.    Dispense:  1 Inhaler    Refill:  11  . metFORMIN (GLUCOPHAGE) 500 MG tablet    Sig: Take 1 tablet (500 mg total) by mouth 2 (two) times daily with a meal.    Dispense:  180 tablet    Refill:  1  . FLUoxetine (PROZAC) 20 MG tablet    Sig: Take 1 tablet (20 mg total) by mouth daily.    Dispense:  90 tablet    Refill:  1    Return in about 4 months (around 12/31/2015) for recheck diabetes.    Adelaide Pfefferkorn Elayne Guerin, M.D. [facility] [address] [place], [place]  [address] [phone] phone [phone] fax

## 2015-08-31 NOTE — Patient Instructions (Addendum)
Because you received labwork today, you will receive an invoice from [facility]. Please contact [facility] at [phone] with questions or concerns regarding your invoice. Our billing staff will not be able to assist you with those questions.  You will be contacted with the lab results as soon as they are available. The fastest way to get your results is to activate your My Chart account. Instructions are located on the last page of this paperwork. If you have not heard from us regarding the results in 2 weeks, please contact this office.  Influenza, Adult Influenza ("the flu") is a viral infection of the respiratory tract. It occurs more often in winter months because people spend more time in close contact with one another. Influenza can make you feel very sick. Influenza easily spreads from person to person (contagious). CAUSES  Influenza is caused by a virus that infects the respiratory tract. You can catch the virus by breathing in droplets from an infected person's cough or sneeze. You can also catch the virus by touching something that was recently contaminated with the virus and then touching your mouth, nose, or eyes. RISKS AND COMPLICATIONS You may be at risk for a more severe case of influenza if you smoke cigarettes, have diabetes, have chronic heart disease (such as heart failure) or lung disease (such as asthma), or if you have a weakened immune system. Elderly people and pregnant women are also at risk for more serious infections. The most common problem of influenza is a lung infection (pneumonia). Sometimes, this problem can require emergency medical care and may be life threatening. SIGNS AND SYMPTOMS  Symptoms typically last 4 to 10 days and may include:  Fever.  Chills.  Headache, body aches, and muscle aches.  Sore throat.  Chest discomfort and cough.  Poor appetite.  Weakness or feeling tired.  Dizziness.  Nausea or vomiting. DIAGNOSIS   Diagnosis of influenza is often made based on your history and a physical exam. A nose or throat swab test can be done to confirm the diagnosis. TREATMENT  In mild cases, influenza goes away on its own. Treatment is directed at relieving symptoms. For more severe cases, your health care provider may prescribe antiviral medicines to shorten the sickness. Antibiotic medicines are not effective because the infection is caused by a virus, not by bacteria. HOME CARE INSTRUCTIONS  Take medicines only as directed by your health care provider.  Use a cool mist humidifier to make breathing easier.  Get plenty of rest until your temperature returns to normal. This usually takes 3 to 4 days.  Drink enough fluid to keep your urine clear or pale yellow.  Cover yourmouth and nosewhen coughing or sneezing,and wash your handswellto prevent thevirusfrom spreading.  Stay homefromwork orschool untilthe fever is gonefor at least 1full day. PREVENTION  An annual influenza vaccination (flu shot) is the best way to avoid getting influenza. An annual flu shot is now routinely recommended for all adults in the [place] SEEK MEDICAL CARE IF:  You experiencechest pain, yourcough worsens,or you producemore mucus.  Youhave nausea,vomiting, ordiarrhea.  Your fever returns or gets worse. SEEK IMMEDIATE MEDICAL CARE IF:  You havetrouble breathing, you become short of breath,or your skin ornails becomebluish.  You have severe painor stiffnessin the neck.  You develop a sudden headache, or pain in the face or ear.  You have nausea or vomiting that you cannot control. MAKE SURE YOU:   Understand these instructions.  Will watch your condition.  Will   get help right away if you are not doing well or get worse.   This information is not intended to replace advice given to you by your health care provider. Make sure you discuss any questions you have with your health care provider.    Document Released: 06/08/2000 Document Revised: 07/02/2014 Document Reviewed: 09/10/2011 Elsevier Interactive Patient Education 2016 Elsevier Inc.  

## 2015-09-02 NOTE — Telephone Encounter (Signed)
Misty Franco with [facility] is calling regarding the patient's Rx rizatriptan (MAXALT-MLT) 10 MG disintegrating tablet. She states she needs to talk to someone today because they keep getting requests from [facility] and it needs to be corrected today. Thank you.

## 2015-09-02 NOTE — Addendum Note (Signed)
Addended by: Minna Antis on: 09/02/2015 12:34 PM   Modules accepted: Medications

## 2015-09-02 NOTE — Telephone Encounter (Signed)
Alexia with [facility] [phone] called said a decision would be made no later than 09/05/15.

## 2015-09-02 NOTE — Telephone Encounter (Signed)
Spoke with Misty Franco who stated the issue is with Misty Franco charging med refill improperly. Called [facility] and gave correct informaiton re: refill. Pharmacist stated medicaiton will be refilled, pt will be notified when ready.

## 2015-09-09 ENCOUNTER — Telehealth: Payer: Self-pay

## 2015-09-09 MED ORDER — FLUTICASONE PROPIONATE 50 MCG/ACT NA SUSP: 2.0000 | Freq: Every day | NASAL | Status: DC

## 2015-09-09 NOTE — Telephone Encounter (Signed)
Sent in Fluticasone to pharmacy to replace QNASL.  Please call and advise patient.

## 2015-09-09 NOTE — Telephone Encounter (Signed)
Pharm faxed notice that pt's ins does not cover QNASL. The covered alternatives are Fluticasone, Flunisolide, Ipratropium Bromide, Azelastine, and Cetirizine. Dr Tamala Julian, do you want to send Rx for one of these?

## 2015-09-25 ENCOUNTER — Encounter: Payer: Self-pay | Admitting: Family Medicine

## 2015-09-27 ENCOUNTER — Other Ambulatory Visit: Payer: Self-pay | Admitting: Family Medicine

## 2015-10-19 ENCOUNTER — Ambulatory Visit: Payer: Commercial Managed Care - HMO | Admitting: Diagnostic Neuroimaging

## 2015-10-25 IMAGING — CR DG ANKLE COMPLETE 3+V*R*
4 series · 4 of 4 positions shown · non-contrast
Comparison: None.

CLINICAL DATA: Rolled right ankle 1 month ago with continued
generalized pain.

EXAM:
RIGHT ANKLE - COMPLETE 3+ VIEW

[AP]
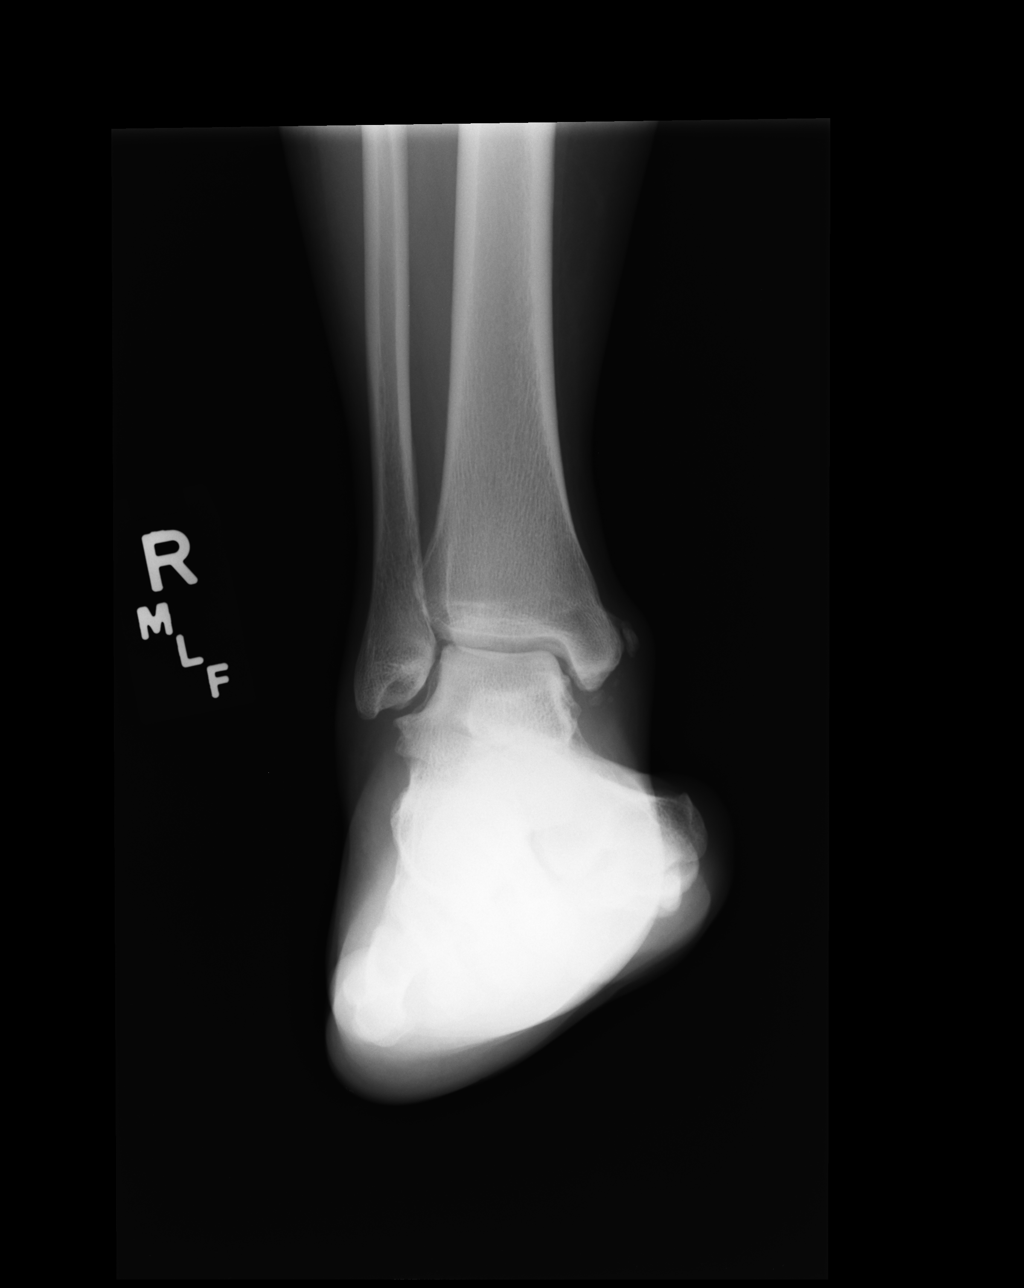

[ap obl int rot]
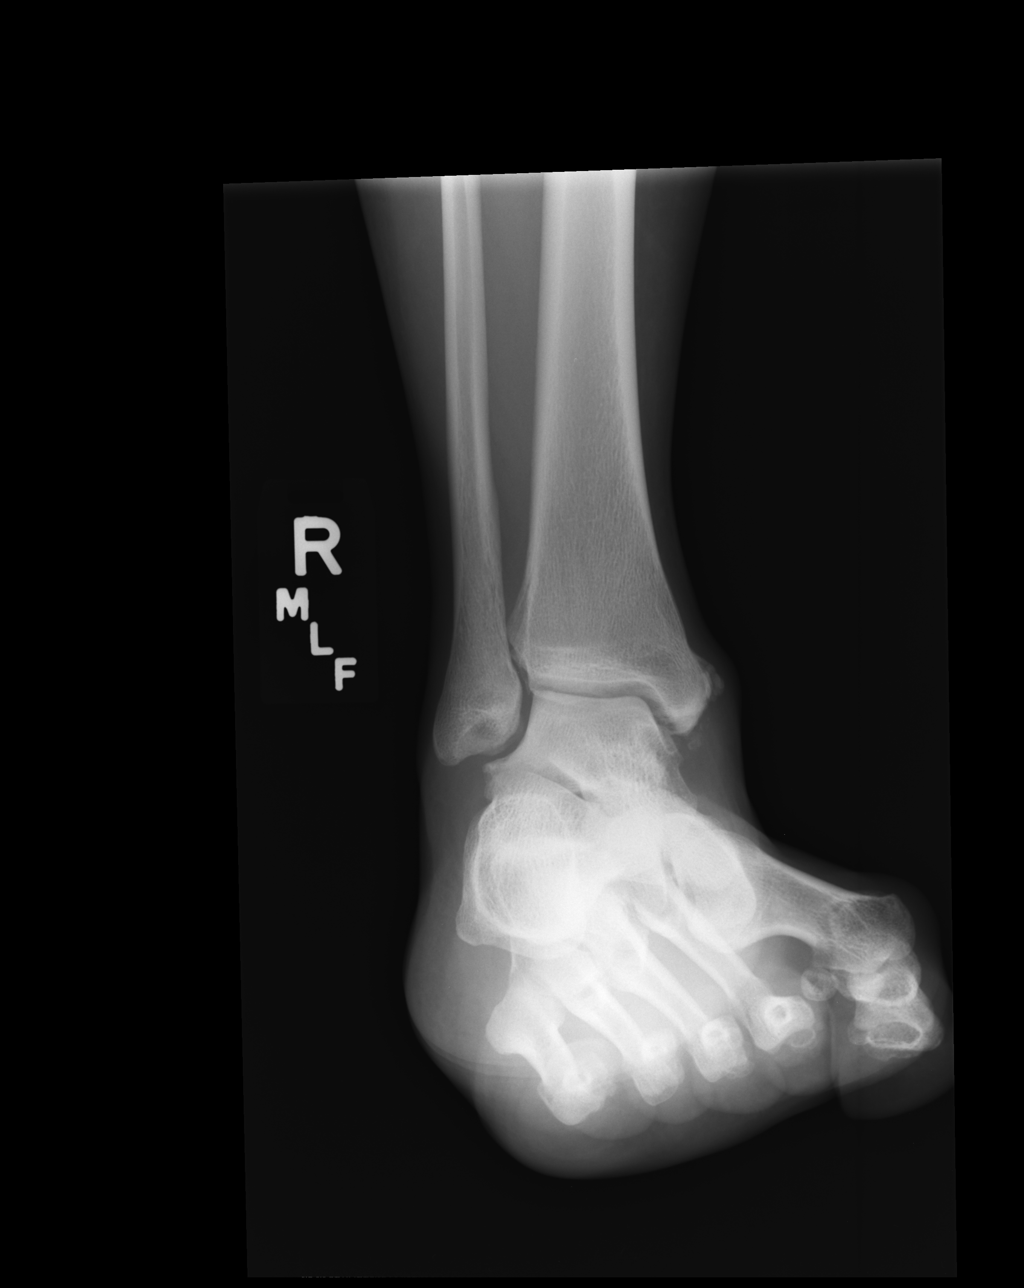

[ap obl ext rot]
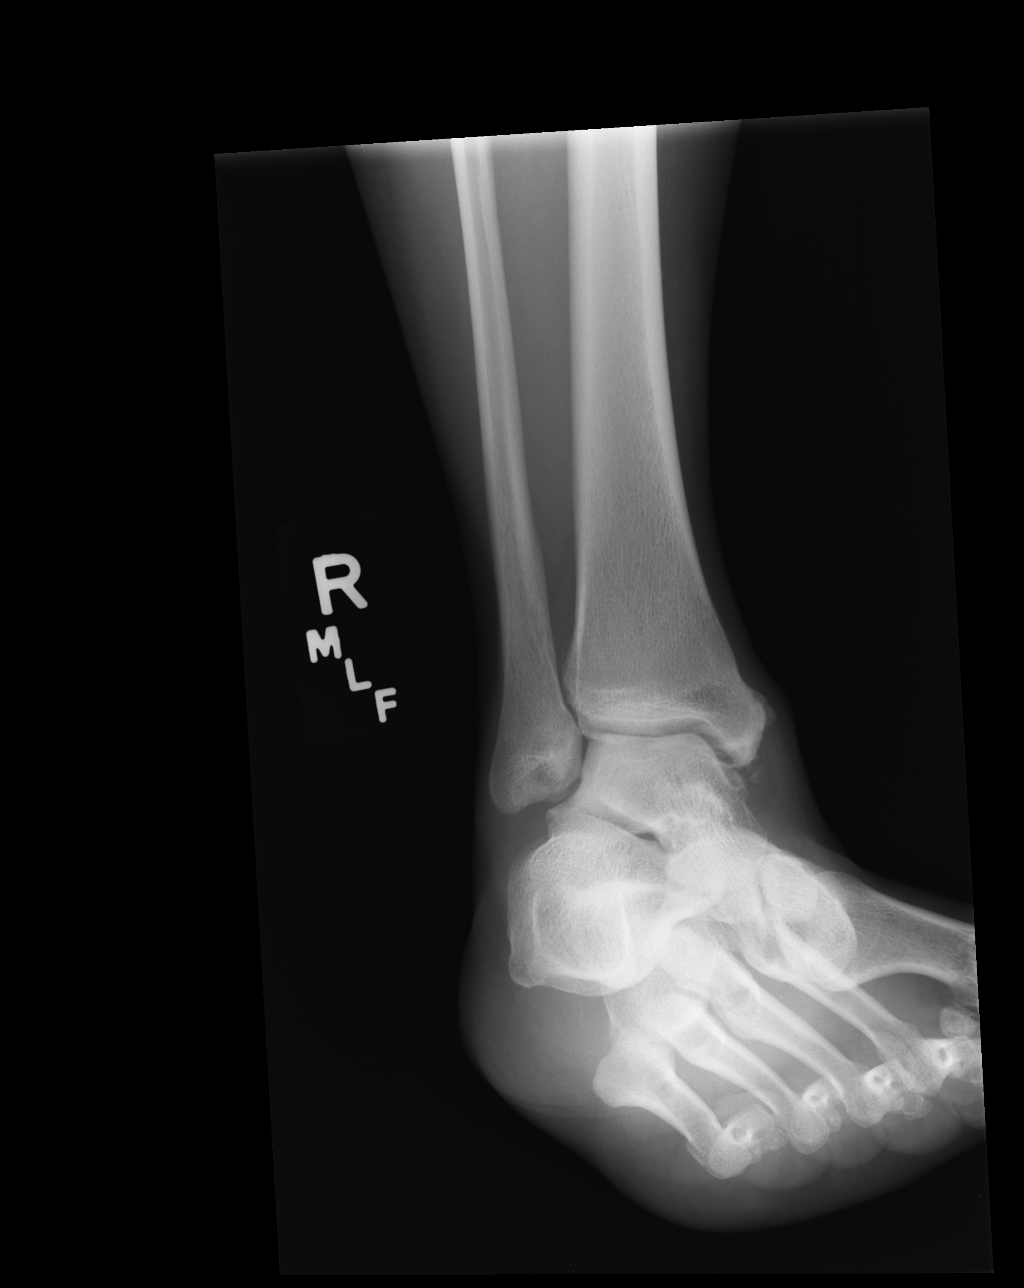

[lateral]
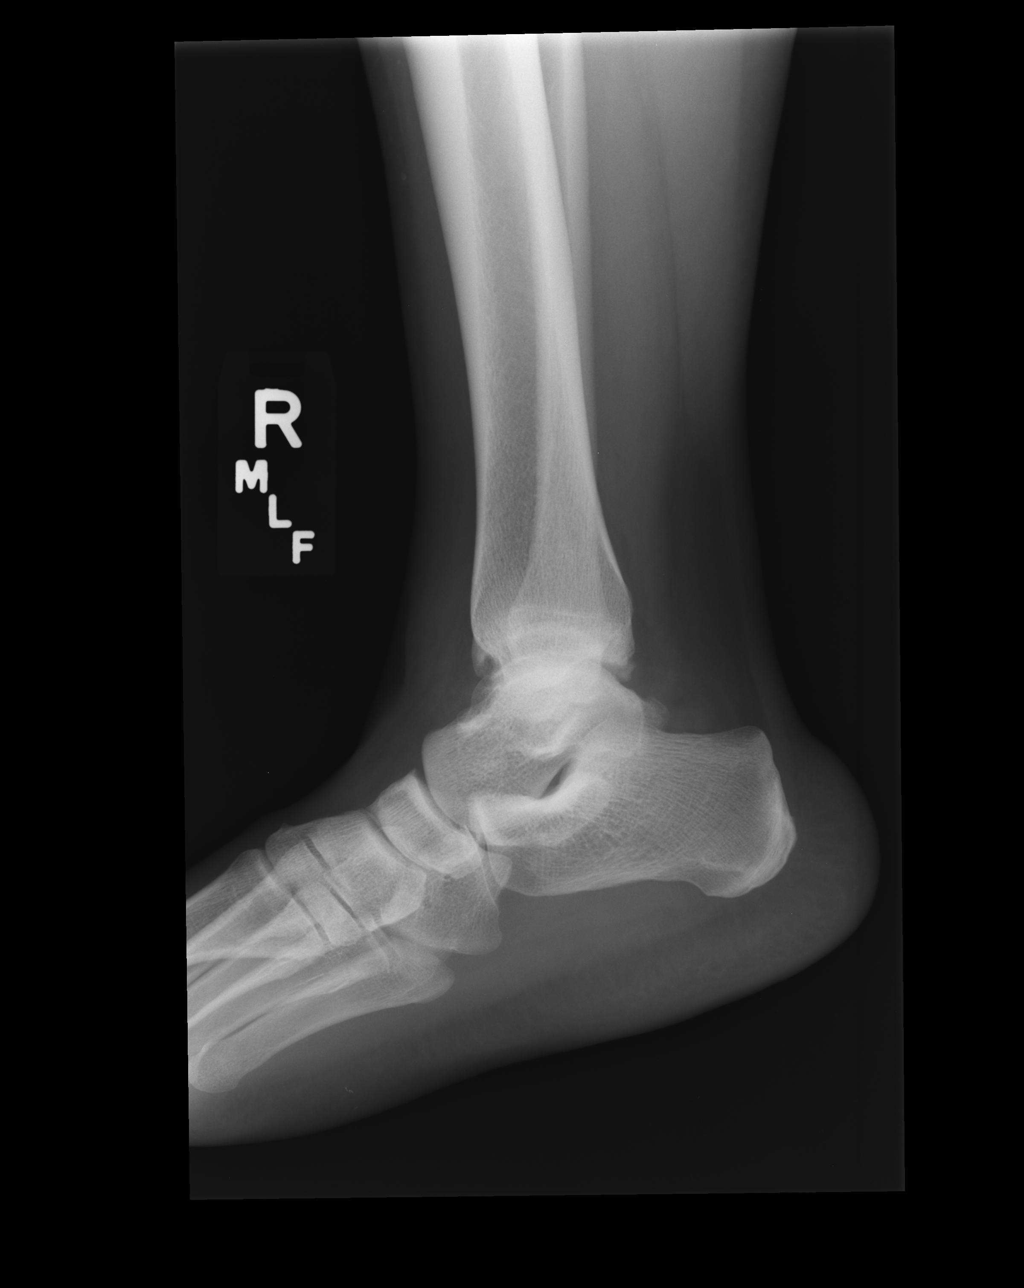

[4 of 4 positions shown; findings below may reference images not displayed]

FINDINGS: Small bone fragments are noted adjacent to the medial malleolus
which appear well corticated and likely reflect old injury. No acute
fracture, subluxation or dislocation. No acute bony abnormality.
Joint spaces are maintained.
IMPRESSION: No acute bony abnormality.

## 2015-11-26 ENCOUNTER — Other Ambulatory Visit: Payer: Self-pay | Admitting: Family Medicine

## 2015-11-28 NOTE — Telephone Encounter (Signed)
Dr Tamala Julian, you saw pt for check up in March and documented that pt was taking trazodone for insomnia with no mention of [place] it. The following sig was on script (I guess left over from prev plan to taper off) : ": Take 1 tab po daily at night for 7 days then 1/2 tab PO daily at night for 7 days, this is a taper. Then stop". I changed the sig to regular one to have pt take 1 tab Qhs, but wanted to get it reviewed by you.

## 2015-11-29 ENCOUNTER — Other Ambulatory Visit: Payer: Self-pay

## 2015-11-29 DIAGNOSIS — M1712 Unilateral primary osteoarthritis, left knee: Secondary | ICD-10-CM

## 2015-11-29 DIAGNOSIS — D329 Benign neoplasm of meninges, unspecified: Secondary | ICD-10-CM

## 2015-11-29 DIAGNOSIS — J45909 Unspecified asthma, uncomplicated: Secondary | ICD-10-CM

## 2015-11-29 DIAGNOSIS — E785 Hyperlipidemia, unspecified: Secondary | ICD-10-CM

## 2015-11-29 DIAGNOSIS — M1990 Unspecified osteoarthritis, unspecified site: Secondary | ICD-10-CM

## 2015-11-29 DIAGNOSIS — E890 Postprocedural hypothyroidism: Secondary | ICD-10-CM

## 2015-11-29 DIAGNOSIS — J3089 Other allergic rhinitis: Secondary | ICD-10-CM

## 2015-11-29 DIAGNOSIS — G43709 Chronic migraine without aura, not intractable, without status migrainosus: Secondary | ICD-10-CM

## 2015-11-29 DIAGNOSIS — E119 Type 2 diabetes mellitus without complications: Secondary | ICD-10-CM

## 2015-11-29 MED ORDER — BUDESONIDE-FORMOTEROL FUMARATE 160-4.5 MCG/ACT IN AERO: 2.0000 | INHALATION_SPRAY | Freq: Two times a day (BID) | RESPIRATORY_TRACT | Status: AC

## 2015-11-29 NOTE — Telephone Encounter (Signed)
Dr. Marin Comment discontinued/tapered the Trazodone to OFF in 01/2015; then Dr. Marin Comment prescribed Amitriptyline.  Pt should not take both Trazodone and Amitriptyline.  Please call patient for further details.

## 2015-12-02 MED ORDER — TRAZODONE HCL 100 MG PO TABS: 100.0000 mg | ORAL_TABLET | Freq: Every evening | ORAL | Status: DC | PRN

## 2015-12-02 NOTE — Telephone Encounter (Signed)
Pt reported that she HAS been taking both the trazodone and amitriptyline. She was not aware that she was supposed to taper off and stop the trazodone. Pt stated that neither medication works by itself. She has been out of the trazodone for about a month and has not been sleeping with just the amitriptyline. Dr Tamala Julian, is there anything else you want to Rx for pt to try?

## 2015-12-02 NOTE — Telephone Encounter (Signed)
Call --- I refilled Trazodone; is she taking one whole trazodone at nighttime or just 1/2 of it?  Is she taking a whole Amitriptyline?

## 2015-12-03 NOTE — Telephone Encounter (Signed)
Left message for pt to return call to clarify medication dosage/time

## 2015-12-26 ENCOUNTER — Other Ambulatory Visit: Payer: Self-pay | Admitting: Family Medicine

## 2015-12-28 ENCOUNTER — Telehealth: Payer: Self-pay | Admitting: *Deleted

## 2015-12-28 NOTE — Telephone Encounter (Signed)
PA started on cover my meds, rizatriptan benzoate

## 2015-12-30 NOTE — Telephone Encounter (Signed)
[facility]- Rizatriptan is available to patient at amount listed, no PA needed.

## 2016-02-24 ENCOUNTER — Other Ambulatory Visit: Payer: Self-pay | Admitting: Family Medicine

## 2016-02-24 DIAGNOSIS — E119 Type 2 diabetes mellitus without complications: Secondary | ICD-10-CM

## 2016-02-24 DIAGNOSIS — J45909 Unspecified asthma, uncomplicated: Secondary | ICD-10-CM

## 2016-02-24 DIAGNOSIS — E785 Hyperlipidemia, unspecified: Secondary | ICD-10-CM

## 2016-02-24 DIAGNOSIS — D329 Benign neoplasm of meninges, unspecified: Secondary | ICD-10-CM

## 2016-02-24 DIAGNOSIS — E890 Postprocedural hypothyroidism: Secondary | ICD-10-CM

## 2016-02-24 DIAGNOSIS — M1990 Unspecified osteoarthritis, unspecified site: Secondary | ICD-10-CM

## 2016-02-24 DIAGNOSIS — G43709 Chronic migraine without aura, not intractable, without status migrainosus: Secondary | ICD-10-CM

## 2016-02-24 DIAGNOSIS — M1712 Unilateral primary osteoarthritis, left knee: Secondary | ICD-10-CM

## 2016-02-24 DIAGNOSIS — J3089 Other allergic rhinitis: Secondary | ICD-10-CM

## 2016-03-30 ENCOUNTER — Other Ambulatory Visit: Payer: Self-pay | Admitting: Family Medicine

## 2016-04-02 ENCOUNTER — Other Ambulatory Visit: Payer: Self-pay

## 2016-04-02 DIAGNOSIS — D329 Benign neoplasm of meninges, unspecified: Secondary | ICD-10-CM

## 2016-04-02 DIAGNOSIS — J3089 Other allergic rhinitis: Secondary | ICD-10-CM

## 2016-04-02 DIAGNOSIS — G43709 Chronic migraine without aura, not intractable, without status migrainosus: Secondary | ICD-10-CM

## 2016-04-02 DIAGNOSIS — M1990 Unspecified osteoarthritis, unspecified site: Secondary | ICD-10-CM

## 2016-04-02 DIAGNOSIS — E890 Postprocedural hypothyroidism: Secondary | ICD-10-CM

## 2016-04-02 DIAGNOSIS — J45909 Unspecified asthma, uncomplicated: Secondary | ICD-10-CM

## 2016-04-02 DIAGNOSIS — M1712 Unilateral primary osteoarthritis, left knee: Secondary | ICD-10-CM

## 2016-04-02 DIAGNOSIS — E785 Hyperlipidemia, unspecified: Secondary | ICD-10-CM

## 2016-04-02 DIAGNOSIS — E119 Type 2 diabetes mellitus without complications: Secondary | ICD-10-CM

## 2016-04-02 MED ORDER — GABAPENTIN 600 MG PO TABS: 600.0000 mg | ORAL_TABLET | Freq: Every day | ORAL | 0 refills | Status: DC

## 2016-04-02 MED ORDER — ALBUTEROL SULFATE HFA 108 (90 BASE) MCG/ACT IN AERS: 2.0000 | INHALATION_SPRAY | Freq: Four times a day (QID) | RESPIRATORY_TRACT | 5 refills | Status: AC | PRN

## 2016-04-02 MED ORDER — SIMVASTATIN 40 MG PO TABS: ORAL_TABLET | ORAL | 0 refills | Status: AC

## 2016-04-02 MED ORDER — LEVOTHYROXINE SODIUM 125 MCG PO TABS: 125.0000 ug | ORAL_TABLET | Freq: Every day | ORAL | 0 refills | Status: AC

## 2016-04-29 ENCOUNTER — Other Ambulatory Visit: Payer: Self-pay | Admitting: Family Medicine

## 2016-04-29 DIAGNOSIS — J45909 Unspecified asthma, uncomplicated: Secondary | ICD-10-CM

## 2016-04-29 DIAGNOSIS — E119 Type 2 diabetes mellitus without complications: Secondary | ICD-10-CM

## 2016-04-29 DIAGNOSIS — G43709 Chronic migraine without aura, not intractable, without status migrainosus: Secondary | ICD-10-CM

## 2016-04-29 DIAGNOSIS — M1712 Unilateral primary osteoarthritis, left knee: Secondary | ICD-10-CM

## 2016-04-29 DIAGNOSIS — D329 Benign neoplasm of meninges, unspecified: Secondary | ICD-10-CM

## 2016-04-29 DIAGNOSIS — M1990 Unspecified osteoarthritis, unspecified site: Secondary | ICD-10-CM

## 2016-04-29 DIAGNOSIS — J3089 Other allergic rhinitis: Secondary | ICD-10-CM

## 2016-04-29 DIAGNOSIS — E890 Postprocedural hypothyroidism: Secondary | ICD-10-CM

## 2016-04-29 DIAGNOSIS — E785 Hyperlipidemia, unspecified: Secondary | ICD-10-CM

## 2016-04-30 ENCOUNTER — Ambulatory Visit (INDEPENDENT_AMBULATORY_CARE_PROVIDER_SITE_OTHER): Payer: Commercial Managed Care - HMO | Admitting: Family Medicine

## 2016-04-30 DIAGNOSIS — Z5329 Procedure and treatment not carried out because of patient's decision for other reasons: Secondary | ICD-10-CM

## 2016-05-01 NOTE — Progress Notes (Signed)
Patient left without being seen due to balance

## 2016-05-05 DIAGNOSIS — Z23 Encounter for immunization: Secondary | ICD-10-CM | POA: Diagnosis not present

## 2016-05-05 DIAGNOSIS — Z760 Encounter for issue of repeat prescription: Secondary | ICD-10-CM | POA: Diagnosis not present

## 2016-06-04 ENCOUNTER — Other Ambulatory Visit: Payer: Self-pay | Admitting: Family Medicine

## 2016-06-05 ENCOUNTER — Telehealth: Payer: Self-pay | Admitting: *Deleted

## 2016-06-05 NOTE — Telephone Encounter (Signed)
Patient states she is not taking metformin anymore it was making her sick.   Patient was advised she is due for a Diabetic check.    Patient understood.

## 2016-06-05 NOTE — Telephone Encounter (Signed)
What is patient follow up plan for Diabetes check.   Will send in half supply  She will need to get in for a follow up

## 2016-06-13 DIAGNOSIS — J4522 Mild intermittent asthma with status asthmaticus: Secondary | ICD-10-CM | POA: Diagnosis not present

## 2016-06-13 DIAGNOSIS — Z1239 Encounter for other screening for malignant neoplasm of breast: Secondary | ICD-10-CM | POA: Diagnosis not present

## 2016-06-13 DIAGNOSIS — E039 Hypothyroidism, unspecified: Secondary | ICD-10-CM | POA: Diagnosis not present

## 2016-06-13 DIAGNOSIS — G4700 Insomnia, unspecified: Secondary | ICD-10-CM | POA: Diagnosis not present

## 2016-06-13 DIAGNOSIS — E0842 Diabetes mellitus due to underlying condition with diabetic polyneuropathy: Secondary | ICD-10-CM | POA: Diagnosis not present

## 2016-06-13 DIAGNOSIS — E119 Type 2 diabetes mellitus without complications: Secondary | ICD-10-CM | POA: Diagnosis not present

## 2016-06-13 DIAGNOSIS — M2550 Pain in unspecified joint: Secondary | ICD-10-CM | POA: Diagnosis not present

## 2016-06-13 DIAGNOSIS — E784 Other hyperlipidemia: Secondary | ICD-10-CM | POA: Diagnosis not present

## 2016-07-17 DIAGNOSIS — M25512 Pain in left shoulder: Secondary | ICD-10-CM | POA: Diagnosis not present

## 2016-07-17 DIAGNOSIS — J302 Other seasonal allergic rhinitis: Secondary | ICD-10-CM | POA: Diagnosis not present

## 2016-07-17 DIAGNOSIS — E784 Other hyperlipidemia: Secondary | ICD-10-CM | POA: Diagnosis not present

## 2016-07-17 DIAGNOSIS — M25519 Pain in unspecified shoulder: Secondary | ICD-10-CM | POA: Diagnosis not present

## 2016-07-17 DIAGNOSIS — J4522 Mild intermittent asthma with status asthmaticus: Secondary | ICD-10-CM | POA: Diagnosis not present

## 2016-07-17 DIAGNOSIS — E119 Type 2 diabetes mellitus without complications: Secondary | ICD-10-CM | POA: Diagnosis not present

## 2016-07-17 DIAGNOSIS — E039 Hypothyroidism, unspecified: Secondary | ICD-10-CM | POA: Diagnosis not present

## 2016-07-31 DIAGNOSIS — E1142 Type 2 diabetes mellitus with diabetic polyneuropathy: Secondary | ICD-10-CM | POA: Diagnosis not present

## 2016-07-31 DIAGNOSIS — G894 Chronic pain syndrome: Secondary | ICD-10-CM | POA: Diagnosis not present

## 2016-07-31 DIAGNOSIS — M75102 Unspecified rotator cuff tear or rupture of left shoulder, not specified as traumatic: Secondary | ICD-10-CM | POA: Diagnosis not present

## 2016-07-31 DIAGNOSIS — E039 Hypothyroidism, unspecified: Secondary | ICD-10-CM | POA: Diagnosis not present

## 2016-07-31 DIAGNOSIS — J4522 Mild intermittent asthma with status asthmaticus: Secondary | ICD-10-CM | POA: Diagnosis not present

## 2016-08-10 ENCOUNTER — Other Ambulatory Visit: Payer: Self-pay | Admitting: Diagnostic Neuroimaging

## 2016-10-04 DIAGNOSIS — J4522 Mild intermittent asthma with status asthmaticus: Secondary | ICD-10-CM | POA: Diagnosis not present

## 2016-10-04 DIAGNOSIS — E1142 Type 2 diabetes mellitus with diabetic polyneuropathy: Secondary | ICD-10-CM | POA: Diagnosis not present

## 2016-10-04 DIAGNOSIS — G894 Chronic pain syndrome: Secondary | ICD-10-CM | POA: Diagnosis not present

## 2016-10-04 DIAGNOSIS — E039 Hypothyroidism, unspecified: Secondary | ICD-10-CM | POA: Diagnosis not present

## 2016-10-04 DIAGNOSIS — F329 Major depressive disorder, single episode, unspecified: Secondary | ICD-10-CM | POA: Diagnosis not present

## 2016-10-16 ENCOUNTER — Other Ambulatory Visit: Payer: Self-pay | Admitting: Family Medicine

## 2016-10-17 NOTE — Telephone Encounter (Signed)
Refill of Gabapentin denied.  Patient is overdue for follow-up; last office visit in 08/2015.  She also NO-SHOWED with Dr. Nolon Rod in 04/2016.  Please schedule appointment for patient with Tamala Julian or Nolon Rod.

## 2016-11-23 ENCOUNTER — Other Ambulatory Visit: Payer: Self-pay | Admitting: Family Medicine

## 2017-02-25 ENCOUNTER — Other Ambulatory Visit: Payer: Self-pay | Admitting: Family Medicine

## 2017-02-25 DIAGNOSIS — E890 Postprocedural hypothyroidism: Secondary | ICD-10-CM

## 2017-02-25 DIAGNOSIS — J45909 Unspecified asthma, uncomplicated: Secondary | ICD-10-CM

## 2017-02-25 DIAGNOSIS — J3089 Other allergic rhinitis: Secondary | ICD-10-CM

## 2017-02-25 DIAGNOSIS — E119 Type 2 diabetes mellitus without complications: Secondary | ICD-10-CM

## 2017-02-25 DIAGNOSIS — M1990 Unspecified osteoarthritis, unspecified site: Secondary | ICD-10-CM

## 2017-02-25 DIAGNOSIS — M1712 Unilateral primary osteoarthritis, left knee: Secondary | ICD-10-CM

## 2017-02-25 DIAGNOSIS — D329 Benign neoplasm of meninges, unspecified: Secondary | ICD-10-CM

## 2017-02-25 DIAGNOSIS — E785 Hyperlipidemia, unspecified: Secondary | ICD-10-CM

## 2017-02-25 DIAGNOSIS — G43709 Chronic migraine without aura, not intractable, without status migrainosus: Secondary | ICD-10-CM

## 2017-04-26 ENCOUNTER — Other Ambulatory Visit: Payer: Self-pay | Admitting: Family Medicine

## 2017-04-26 DIAGNOSIS — E890 Postprocedural hypothyroidism: Secondary | ICD-10-CM

## 2017-04-26 DIAGNOSIS — E785 Hyperlipidemia, unspecified: Secondary | ICD-10-CM

## 2017-04-26 DIAGNOSIS — M1990 Unspecified osteoarthritis, unspecified site: Secondary | ICD-10-CM

## 2017-04-26 DIAGNOSIS — G43709 Chronic migraine without aura, not intractable, without status migrainosus: Secondary | ICD-10-CM

## 2017-04-26 DIAGNOSIS — D329 Benign neoplasm of meninges, unspecified: Secondary | ICD-10-CM

## 2017-04-26 DIAGNOSIS — E119 Type 2 diabetes mellitus without complications: Secondary | ICD-10-CM

## 2017-04-26 DIAGNOSIS — M1712 Unilateral primary osteoarthritis, left knee: Secondary | ICD-10-CM

## 2017-04-26 DIAGNOSIS — J45909 Unspecified asthma, uncomplicated: Secondary | ICD-10-CM

## 2017-04-26 DIAGNOSIS — J3089 Other allergic rhinitis: Secondary | ICD-10-CM

## 2017-11-14 ENCOUNTER — Encounter: Payer: Self-pay | Admitting: Family Medicine

## 2019-09-18 ENCOUNTER — Ambulatory Visit: Payer: Self-pay | Attending: Internal Medicine

## 2019-09-18 ENCOUNTER — Ambulatory Visit: Payer: Self-pay

## 2019-09-18 DIAGNOSIS — Z23 Encounter for immunization: Secondary | ICD-10-CM

## 2019-09-18 NOTE — Progress Notes (Signed)
   Covid-19 Vaccination Clinic  Name:  Misty Franco    MRN: [id number] DOB: 1966-03-15  09/18/2019  [name] was observed post Covid-19 immunization for 15 minutes without incident. She was provided with Vaccine Information Sheet and instruction to access the V-Safe system.   [name] was instructed to call 911 with any severe reactions post vaccine: Marland Kitchen Difficulty breathing  . Swelling of face and throat  . A fast heartbeat  . A bad rash all over body  . Dizziness and weakness   Immunizations Administered    Name Date Dose VIS Date Route   Pfizer COVID-19 Vaccine 09/18/2019  8:42 AM 0.3 mL 06/05/2019 Intramuscular   Manufacturer: [place]   Lot: [id number]   [place]: [id number]

## 2019-10-13 ENCOUNTER — Ambulatory Visit: Payer: Self-pay | Attending: Internal Medicine

## 2019-10-13 DIAGNOSIS — Z23 Encounter for immunization: Secondary | ICD-10-CM

## 2019-10-13 NOTE — Progress Notes (Signed)
   Covid-19 Vaccination Clinic  Name:  Misty Franco    MRN: [id number] DOB: 1966/04/19  10/13/2019  Misty Franco was observed post Covid-19 immunization for 30 minutes based on pre-vaccination screening without incident. She was provided with Vaccine Information Sheet and instruction to access the V-Safe system.   Misty Franco was instructed to call 911 with any severe reactions post vaccine: Marland Kitchen Difficulty breathing  . Swelling of face and throat  . A fast heartbeat  . A bad rash all over body  . Dizziness and weakness   Immunizations Administered    Name Date Dose VIS Date Route   Pfizer COVID-19 Vaccine 10/13/2019  8:15 AM 0.3 mL 08/19/2018 Intramuscular   Manufacturer: [place]   Lot: [id number]   [place]: [id number]

## 2021-05-06 ENCOUNTER — Encounter (HOSPITAL_COMMUNITY): Payer: Self-pay

## 2021-05-06 ENCOUNTER — Other Ambulatory Visit: Payer: Self-pay

## 2021-05-06 ENCOUNTER — Emergency Department (HOSPITAL_COMMUNITY)
Admission: EM | Admit: 2021-05-06 | Discharge: 2021-05-06 | Disposition: A | Discharge status: Home / Self Care | Payer: Medicare (Managed Care) | Attending: Emergency Medicine | Admitting: Emergency Medicine

## 2021-05-06 DIAGNOSIS — Z7984 Long term (current) use of oral hypoglycemic drugs: Secondary | ICD-10-CM | POA: Diagnosis not present

## 2021-05-06 DIAGNOSIS — K0889 Other specified disorders of teeth and supporting structures: Secondary | ICD-10-CM | POA: Diagnosis present

## 2021-05-06 DIAGNOSIS — K029 Dental caries, unspecified: Secondary | ICD-10-CM | POA: Insufficient documentation

## 2021-05-06 DIAGNOSIS — Z79899 Other long term (current) drug therapy: Secondary | ICD-10-CM | POA: Insufficient documentation

## 2021-05-06 DIAGNOSIS — J45909 Unspecified asthma, uncomplicated: Secondary | ICD-10-CM | POA: Diagnosis not present

## 2021-05-06 DIAGNOSIS — E119 Type 2 diabetes mellitus without complications: Secondary | ICD-10-CM | POA: Insufficient documentation

## 2021-05-06 MED ORDER — CLINDAMYCIN HCL 300 MG PO CAPS: 300.0000 mg | ORAL_CAPSULE | Freq: Four times a day (QID) | ORAL | 0 refills | Status: DC

## 2021-05-06 NOTE — Discharge Instructions (Addendum)
Return to ED with any new or worsening symptoms Follow up with dentist and make appointment to have cavities filled Avoid hot, cold, sweet food or drinks Treat pain with ice packs applied to cheeks Rinse mouth with warm salt water every 2 hours to relieve pain and swelling Avoid smoking Alternate Motrin and Tylenol as needed for pain Fill antibiotic prescription and take as directed

## 2021-05-06 NOTE — ED Provider Notes (Addendum)
[facility] EMERGENCY DEPARTMENT Provider Note   CSN: [id number] Arrival date & time: 05/06/21  1044     History No chief complaint on file.   Misty Franco is a 55 y.o. female who presents to ED for 1 week of ongoing dental pain. Patient states that a filling came out a week ago and her tooth began to hurt as a result. Patient states that she has not seen a dentist in 3 years as a result of insurance change. Patient states she has taken meloxican and a muscle relaxer to relieve pain with some affect. Patient denies fever, chills, drooling, neck stiffness, trouble swallowing or change in phonation. Patient endorses mouth pain.   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   Depression    Diabetes mellitus without complication ([place])    Meningioma ([place])    Panic attacks    Thyroid disease     Patient Active Problem List   Diagnosis Date Noted   Migraine with aura and with status migrainosus, not intractable 03/01/2015   Chronic migraine without aura without status migrainosus, not intractable 02/02/2015   Meningioma ([place]) 02/02/2015   Type 2 diabetes mellitus without complication ([place]) 35/00/9381   Postoperative hypothyroidism 02/02/2015   Asthma, chronic 02/02/2015   Arthritis 02/02/2015   Dyslipidemia 02/02/2015   Environmental and seasonal allergies 02/02/2015    Past Surgical History:  Procedure Laterality Date   ABDOMINAL HYSTERECTOMY  2008   partial   thyroidectomy  2011   partial     OB History   No obstetric history on file.     Family History  Problem Relation Age of Onset   Thyroid disease Mother    Diabetes Daughter    Asthma Daughter     Social History   Tobacco Use   Smoking status: Never   Smokeless tobacco: Never  Substance Use Topics   Alcohol use: No    Alcohol/week: 0.0 standard drinks    Comment: 03/01/15 "wine on occasion"    Drug use: Yes    Frequency: 7.0 times per week    Types: Marijuana    Comment: use 7x week , 03/01/15" marijuana almost daily"    Home Medications Prior to Admission medications   Medication Sig Start Date End Date Taking? Authorizing Provider  albuterol (PROVENTIL HFA;VENTOLIN HFA) 108 (90 Base) MCG/ACT inhaler Inhale 2 puffs into the lungs every 6 (six) hours as needed. 04/02/16   Wardell Honour, MD  amitriptyline (ELAVIL) 100 MG tablet Take 1 tablet (100 mg total) by mouth at bedtime. 02/01/15   Le, Thao P, DO  Beclomethasone Dipropionate (QNASL) 80 MCG/ACT AERS Place 1 spray into both nostrils 2 (two) times daily as needed (for allergies). 08/31/15   Wardell Honour, MD  budesonide-formoterol [facility]) 160-4.5 MCG/ACT inhaler Inhale 2 puffs into the lungs 2 (two) times daily. 11/29/15   Wardell Honour, MD  cyclobenzaprine (FLEXERIL) 10 MG tablet TAKE 1 TABLET BY MOUTH TWICE DAILY AS NEEDED FOR MUSCLE SPASM 09/29/15   Wardell Honour, MD  Dextromethorphan-Guaifenesin [facility] DM) 30-600 MG TB12 Take 1 tablet by mouth 2 (two) times daily. 08/31/15   Wardell Honour, MD  FLUoxetine (PROZAC) 20 MG tablet TAKE 1 TABLET(20 MG) BY MOUTH DAILY 12/28/15   Wardell Honour, MD  fluticasone [facility]) 50  MCG/ACT nasal spray Place 2 sprays into both nostrils daily. 09/09/15   Wardell Honour, MD  gabapentin (NEURONTIN) 600 MG tablet Take 1 tablet (600 mg total) by mouth daily. 04/02/16   Wardell Honour, MD  ibuprofen (ADVIL,MOTRIN) 800 MG tablet TAKE 1 TABLET BY MOUTH THREE TIMES DAILY 11/28/15   Wardell Honour, MD  levalbuterol [facility] Lash) 1.25 MG/3ML nebulizer solution Take 1.25 mg by nebulization every 4 (four) hours as needed for wheezing.    [provider]  levocetirizine (XYZAL) 5 MG tablet Take 1 tablet (5 mg total) by mouth every evening. 08/31/15   Wardell Honour, MD  levothyroxine (SYNTHROID, LEVOTHROID) 125 MCG tablet Take 1 tablet (125 mcg total) by mouth daily before breakfast. 04/02/16   Wardell Honour, MD  metFORMIN (GLUCOPHAGE) 500 MG tablet TAKE 1 TABLET BY MOUTH TWICE DAILY WITH A MEAL 06/05/16   Wardell Honour, MD  montelukast (SINGULAIR) 10 MG tablet Take 1 tablet (10 mg total) by mouth at bedtime. 08/31/15   Wardell Honour, MD  Moxifloxacin HCl (MOXEZA) 0.5 % SOLN Place 1 drop into both eyes daily as needed (for allergies).    [provider]  olopatadine (PATANOL) 0.1 % ophthalmic solution 1 drop 2 (two) times daily.    [provider]  omeprazole (PRILOSEC) 40 MG capsule Take 1 capsule (40 mg total) by mouth daily. 08/31/15   Wardell Honour, MD  oseltamivir (TAMIFLU) 75 MG capsule Take 1 capsule (75 mg total) by mouth 2 (two) times daily. 08/31/15   Wardell Honour, MD  predniSONE (DELTASONE) 20 MG tablet Two tablets daily x 5 days then one tablet daily x 5 days 08/31/15   Wardell Honour, MD  rizatriptan (MAXALT-MLT) 10 MG disintegrating tablet Take 1 tablet (10 mg total) by mouth as needed for migraine. May repeat in 2 hours if needed 07/22/15   Penumalli, Earlean Polka, MD  simvastatin (ZOCOR) 40 MG tablet TAKE 1 TABLET(40 MG) BY MOUTH DAILY 04/02/16   Wardell Honour, MD  sitaGLIPtin-metformin (JANUMET) 50-500 MG tablet Take 1 tablet by mouth 2 (two) times daily with a meal. 08/17/15   Le, Thao P, DO  topiramate (TOPAMAX) 50 MG tablet TAKE 1 TABLET(50 MG) BY MOUTH TWICE DAILY 08/10/16   Penumalli, Earlean Polka, MD  traZODone (DESYREL) 100 MG tablet Take 1 tablet (100 mg total) by mouth at bedtime as needed for sleep. 12/02/15   Wardell Honour, MD  Vitamin D, Ergocalciferol, (DRISDOL) 50000 UNITS CAPS capsule Take 50,000 Units by mouth every 7 (seven) days.    [provider]    Allergies    Penicillins and Iodine  Review of Systems   Review of Systems  Constitutional:  Negative for fever.  HENT:  Positive for dental problem and facial swelling. Negative for drooling, mouth sores, trouble swallowing and voice change.   Respiratory:  Negative for shortness of  breath.   Cardiovascular:  Negative for chest pain.  Gastrointestinal:  Negative for abdominal pain.  All other systems reviewed and are negative.  Physical Exam Updated Vital Signs BP (!) 151/79 (BP Location: Right Arm)   Pulse 79   Temp 98.6 F (37 C) (Oral)   Resp 20   SpO2 97%   Physical Exam HENT:     Head: Normocephalic.     Mouth/Throat:     Lips: Pink.     Mouth: Mucous membranes are dry. No oral lesions.     Dentition: Abnormal dentition. Dental  tenderness, gingival swelling and dental caries present. No dental abscesses or gum lesions.     Pharynx: Posterior oropharyngeal erythema present. No uvula swelling.     Tonsils: No tonsillar exudate or tonsillar abscesses.     Comments: Patient has obvious cavity to first right molar. Patient has multiple missing teeth. Patient teeth overall have poor dentition.  Cardiovascular:     Rate and Rhythm: Normal rate and regular rhythm.  Pulmonary:     Effort: Pulmonary effort is normal.     Breath sounds: Normal breath sounds.  Skin:    General: Skin is warm and dry.     Capillary Refill: Capillary refill takes less than 2 seconds.  Neurological:     General: No focal deficit present.     Mental Status: She is alert.    ED Results / Procedures / Treatments   Labs (all labs ordered are listed, but only abnormal results are displayed) Labs Reviewed - No data to display  EKG None  Radiology No results found.  Procedures Procedures   Medications Ordered in ED Medications - No data to display  ED Course  I have reviewed the triage vital signs and the nursing notes.  Pertinent labs & imaging results that were available during my care of the patient were reviewed by me and considered in my medical decision making (see chart for details).    MDM Rules/Calculators/A&P                           16XIH with one week of dental pain. Patient has poor dentition with multiple teeth missing and remaining teeth in decay.  Slight erythema of gums but no identifiable abscess that is requiring drainage. Patient tolerating secretions appropriately, no sublingual tenderness or mass, no trismus, no fever or signs of systemic infection, no hot potato voice, no neck stiffness, no trouble swallowing. Based on lack of red flag symptoms there is no concern for Ludwig Angina or any sign of abscess. No imaging seems to be indicated for this patient. Will be discharged with Clindamycin antibiotic (penicillin allergy) and advised to follow up with dentist (information provided on discharge instructions). Patient stable on discharge and agreeable to plan.   Final Clinical Impression(s) / ED Diagnoses Final diagnoses:  None    Rx / DC Orders ED Discharge Orders     None        Azucena Cecil, PA 05/06/21 1402    Azucena Cecil, [place] 05/06/21 1521    Charlesetta Shanks, MD 05/11/21 [phone]

## 2021-05-06 NOTE — ED Triage Notes (Signed)
Patient complains of right sided dental pain with broken tooth for a while and pain x 3 days. States that it is making her migraines act up, NAD

## 2021-06-15 ENCOUNTER — Other Ambulatory Visit: Payer: Self-pay | Admitting: Internal Medicine

## 2021-06-16 LAB — CBC
HCT: 43.8 % (ref 35.0–45.0)
Hemoglobin: 14.3 g/dL (ref 11.7–15.5)
MCH: 31.2 pg (ref 27.0–33.0)
MCHC: 32.6 g/dL (ref 32.0–36.0)
MCV: 95.4 fL (ref 80.0–100.0)
MPV: 11.9 fL (ref 7.5–12.5)
Platelets: 352 10*3/uL (ref 140–400)
RBC: 4.59 10*6/uL (ref 3.80–5.10)
RDW: 12.2 % (ref 11.0–15.0)
WBC: 6.1 10*3/uL (ref 3.8–10.8)

## 2021-06-16 LAB — COMPLETE METABOLIC PANEL WITH GFR
AG Ratio: 1.2 (calc) (ref 1.0–2.5)
ALT: 16 U/L (ref 6–29)
AST: 18 U/L (ref 10–35)
Albumin: 4 g/dL (ref 3.6–5.1)
Alkaline phosphatase (APISO): 79 U/L (ref 37–153)
BUN: 8 mg/dL (ref 7–25)
CO2: 26 mmol/L (ref 20–32)
Calcium: 9.5 mg/dL (ref 8.6–10.4)
Chloride: 105 mmol/L (ref 98–110)
Creat: 0.9 mg/dL (ref 0.50–1.03)
Globulin: 3.3 g/dL (calc) (ref 1.9–3.7)
Glucose, Bld: 103 mg/dL — ABNORMAL HIGH (ref 65–99)
Potassium: 4.7 mmol/L (ref 3.5–5.3)
Sodium: 140 mmol/L (ref 135–146)
Total Bilirubin: 0.2 mg/dL (ref 0.2–1.2)
Total Protein: 7.3 g/dL (ref 6.1–8.1)
eGFR: 76 mL/min/{1.73_m2} (ref >=60)

## 2021-06-16 LAB — LIPID PANEL
Cholesterol: 197 mg/dL (ref <200)
HDL: 48 mg/dL — ABNORMAL LOW (ref >=50)
LDL Cholesterol (Calc): 122 mg/dL (calc) — ABNORMAL HIGH
Non-HDL Cholesterol (Calc): 149 mg/dL (calc) — ABNORMAL HIGH (ref <130)
Total CHOL/HDL Ratio: 4.1 (calc) (ref <5.0)
Triglycerides: 155 mg/dL — ABNORMAL HIGH (ref <150)

## 2021-06-16 LAB — T4, FREE: Free T4: 1.5 ng/dL (ref 0.8–1.8)

## 2021-06-16 LAB — VITAMIN D 25 HYDROXY (VIT D DEFICIENCY, FRACTURES): Vit D, 25-Hydroxy: 28 ng/mL — ABNORMAL LOW (ref 30–100)

## 2021-06-16 LAB — TSH: TSH: 0.03 mIU/L — ABNORMAL LOW

## 2024-01-01 ENCOUNTER — Telehealth: Payer: Self-pay

## 2024-01-01 ENCOUNTER — Ambulatory Visit (AMBULATORY_SURGERY_CENTER)

## 2024-01-01 VITALS — Ht 62.0 in | Wt 177.0 lb

## 2024-01-01 DIAGNOSIS — Z1211 Encounter for screening for malignant neoplasm of colon: Secondary | ICD-10-CM

## 2024-01-01 MED ORDER — NA SULFATE-K SULFATE-MG SULF 17.5-3.13-1.6 GM/177ML PO SOLN: 1.0000 | Freq: Once | ORAL | 0 refills | Status: AC

## 2024-01-01 NOTE — Telephone Encounter (Signed)
In progess

## 2024-01-01 NOTE — Telephone Encounter (Signed)
Mutiple attempts made to complete PV. Unable to reach patient. VM left. Pt to call the office back by 5 PM to have PV rescheduled. Pt made aware that in the event that we do not hear back from them their PV and scheduled procedure will be cancelled.  

## 2024-01-01 NOTE — Progress Notes (Signed)
 No egg or soy allergy known to patient  No issues known to pt with past sedation with any surgeries or procedures Patient denies ever being told they had issues or difficulty with intubation  No FH of Malignant Hyperthermia Pt is not on diet pills Pt is not on  home 02  Pt is not on blood thinners  Pt denies issues with constipation  No A fib or A flutter Have any cardiac testing pending--no  LOA: independent  Prep: suprep    PV completed with patient. Prep instructions sent to home address.

## 2024-01-03 ENCOUNTER — Encounter: Payer: Self-pay | Admitting: Internal Medicine

## 2024-01-17 ENCOUNTER — Encounter: Payer: Self-pay | Admitting: Internal Medicine

## 2024-01-17 ENCOUNTER — Ambulatory Visit: Payer: Self-pay | Admitting: Internal Medicine

## 2024-01-17 VITALS — BP 131/65 | HR 69 | Temp 98.2°F | Resp 19 | Ht 62.0 in | Wt 177.0 lb

## 2024-01-17 DIAGNOSIS — D124 Benign neoplasm of descending colon: Secondary | ICD-10-CM

## 2024-01-17 DIAGNOSIS — K648 Other hemorrhoids: Secondary | ICD-10-CM

## 2024-01-17 DIAGNOSIS — Z1211 Encounter for screening for malignant neoplasm of colon: Secondary | ICD-10-CM | POA: Diagnosis not present

## 2024-01-17 DIAGNOSIS — K635 Polyp of colon: Secondary | ICD-10-CM

## 2024-01-17 MED ORDER — SODIUM CHLORIDE 0.9 % IV SOLN: 500.0000 mL | Freq: Once | INTRAVENOUS | Status: DC

## 2024-01-17 NOTE — Progress Notes (Signed)
 Called to room to assist during endoscopic procedure.  Patient ID and intended procedure confirmed with present staff. Received instructions for my participation in the procedure from the performing physician.

## 2024-01-17 NOTE — Progress Notes (Signed)
 Notified J Nulty of last stool ,ate last and drank last time ,of last seizure time and patient removed nose and lip ring

## 2024-01-17 NOTE — Patient Instructions (Signed)
 Resume previous diet and medications.  Handout provided on polyps.  Biopsy results and follow up colonoscopy date will be sent via MyChart or letter.    YOU HAD AN ENDOSCOPIC PROCEDURE TODAY AT THE [facility]:   Refer to the procedure report that was given to you for any specific questions about what was found during the examination.  If the procedure report does not answer your questions, please call your gastroenterologist to clarify.  If you requested that your care partner not be given the details of your procedure findings, then the procedure report has been included in a sealed envelope for you to review at your convenience later.  YOU SHOULD EXPECT: Some feelings of bloating in the abdomen. Passage of more gas than usual.  Walking can help get rid of the air that was put into your GI tract during the procedure and reduce the bloating. If you had a lower endoscopy (such as a colonoscopy or flexible sigmoidoscopy) you may notice spotting of blood in your stool or on the toilet paper. If you underwent a bowel prep for your procedure, you may not have a normal bowel movement for a few days.  Please Note:  You might notice some irritation and congestion in your nose or some drainage.  This is from the oxygen  used during your procedure.  There is no need for concern and it should clear up in a day or so.  SYMPTOMS TO REPORT IMMEDIATELY:  Following lower endoscopy (colonoscopy or flexible sigmoidoscopy):  Excessive amounts of blood in the stool  Significant tenderness or worsening of abdominal pains  Swelling of the abdomen that is new, acute  Fever of 100F or higher  For urgent or emergent issues, a gastroenterologist can be reached at any hour by calling [phone]. Do not use MyChart messaging for urgent concerns.    DIET:  We do recommend a small meal at first, but then you may proceed to your regular diet.  Drink plenty of fluids but you should avoid alcoholic beverages  for 24 hours.  ACTIVITY:  You should plan to take it easy for the rest of today and you should NOT DRIVE or use heavy machinery until tomorrow (because of the sedation medicines used during the test).    FOLLOW UP: Our staff will call the number listed on your records the next business day following your procedure.  We will call around 7:15- 8:00 am to check on you and address any questions or concerns that you may have regarding the information given to you following your procedure. If we do not reach you, we will leave a message.     If any biopsies were taken you will be contacted by phone or by letter within the next 1-3 weeks.  Please call us  at [phone] if you have not heard about the biopsies in 3 weeks.    SIGNATURES/CONFIDENTIALITY: You and/or your care partner have signed paperwork which will be entered into your electronic medical record.  These signatures attest to the fact that that the information above on your After Visit Summary has been reviewed and is understood.  Full responsibility of the confidentiality of this discharge information lies with you and/or your care-partner.

## 2024-01-17 NOTE — Op Note (Signed)
 [facility] Patient Name: Misty Franco Procedure Date: 01/17/2024 7:42 AM MRN: [id number] Endoscopist: Norleen SAILOR. Abran , MD, [id number] Age: 58 Referring MD:  Date of Birth: [birth year] Gender: Female Account #: [id number] Procedure:                Colonoscopy with cold snare polypectomy x 1 Indications:              Screening for colorectal malignant neoplasm Medicines:                Monitored Anesthesia Care Procedure:                Pre-Anesthesia Assessment:                           - Prior to the procedure, a History and Physical                            was performed, and patient medications and                            allergies were reviewed. The patient's tolerance of                            previous anesthesia was also reviewed. The risks                            and benefits of the procedure and the sedation                            options and risks were discussed with the patient.                            All questions were answered, and informed consent                            was obtained. Prior Anticoagulants: The patient has                            taken no anticoagulant or antiplatelet agents.                            After reviewing the risks and benefits, the patient                            was deemed in satisfactory condition to undergo the                            procedure.                           After obtaining informed consent, the colonoscope                            was passed under direct vision. Throughout the  procedure, the patient's blood pressure, pulse, and                            oxygen  saturations were monitored continuously. The                            CF [id number] #[id number] was introduced through the anus                            and advanced to the the cecum, identified by                            appendiceal orifice and ileocecal valve. The                            ileocecal  valve, appendiceal orifice, and rectum                            were photographed. The quality of the bowel                            preparation was excellent. The colonoscopy was                            performed without difficulty. The patient tolerated                            the procedure well. The bowel preparation used was                            SUPREP via split dose instruction. Scope In: 8:35:07 AM Scope Out: 8:52:14 AM Scope Withdrawal Time: 0 hours 13 minutes 26 seconds  Total Procedure Duration: 0 hours 17 minutes 7 seconds  Findings:                 A 2 mm polyp was found in the descending colon. The                            polyp was removed with a cold snare. Resection and                            retrieval were complete.                           Internal hemorrhoids were found during retroflexion.                           The exam was otherwise without abnormality on                            direct and retroflexion views. Complications:            No immediate complications. Estimated blood loss:  None. Estimated Blood Loss:     Estimated blood loss: none. Impression:               - One 2 mm polyp in the descending colon, removed                            with a cold snare. Resected and retrieved.                           - Internal hemorrhoids.                           - The examination was otherwise normal on direct                            and retroflexion views. Recommendation:           - Repeat colonoscopy in 7-10 years for surveillance.                           - Patient has a contact number available for                            emergencies. The signs and symptoms of potential                            delayed complications were discussed with the                            patient. Return to normal activities tomorrow.                            Written discharge instructions were provided to the                             patient.                           - Resume previous diet.                           - Continue present medications.                           - Await pathology results. Norleen SAILOR. Abran, MD 01/17/2024 9:06:56 AM This report has been signed electronically.

## 2024-01-17 NOTE — Progress Notes (Signed)
 HISTORY OF PRESENT ILLNESS:  Misty Franco is a 58 y.o. female sent for screening colonoscopy.  No complaints  REVIEW OF SYSTEMS:  All non-GI ROS negative except for  Past Medical History:  Diagnosis Date   Allergy    Anxiety    Arthritis    Asthma    Benign tumor 06/25/2009   right side of skull   Depression    Diabetes mellitus without complication ([facility])    Meningioma ([facility])    Panic attacks    Seizures ([facility])    Thyroid  disease     Past Surgical History:  Procedure Laterality Date   ABDOMINAL HYSTERECTOMY  2008   partial   thyroidectomy  2011   partial    Social History Misty Franco  reports that she has never smoked. She has never used smokeless tobacco. She reports current drug use. Frequency: 7.00 times per week. Drug: Marijuana. She reports that she does not drink alcohol.  family history includes Asthma in her daughter; Diabetes in her daughter; Thyroid  disease in her mother.  Allergies  Allergen Reactions   Penicillins Swelling   Iodine Dermatitis       PHYSICAL EXAMINATION: Vital signs: BP (!) 144/75   Pulse 92   Temp 98.2 F (36.8 C)   Ht 5' 2 (1.575 m)   Wt 177 lb (80.3 kg)   SpO2 94%   BMI 32.37 kg/m  General: Well-developed, well-nourished, no acute distress HEENT: Sclerae are anicteric, conjunctiva pink. Oral mucosa intact Lungs: Clear Heart: Regular Abdomen: soft, nontender, nondistended, no obvious ascites, no peritoneal signs, normal bowel sounds. No organomegaly. Extremities: No edema Psychiatric: alert and oriented x3. Cooperative     ASSESSMENT:  Colon cancer screening   PLAN:  Screening colonoscopy

## 2024-01-17 NOTE — Progress Notes (Signed)
 Sedate, gd SR, tolerated procedure well, VSS, report to RN

## 2024-01-20 ENCOUNTER — Telehealth: Payer: Self-pay | Admitting: *Deleted

## 2024-01-20 NOTE — Telephone Encounter (Signed)
 Left message on f/u call

## 2024-01-21 ENCOUNTER — Ambulatory Visit: Payer: Self-pay | Admitting: Internal Medicine

## 2024-01-21 LAB — SURGICAL PATHOLOGY

## 2024-03-16 ENCOUNTER — Emergency Department (HOSPITAL_BASED_OUTPATIENT_CLINIC_OR_DEPARTMENT_OTHER)

## 2024-03-16 ENCOUNTER — Encounter (HOSPITAL_BASED_OUTPATIENT_CLINIC_OR_DEPARTMENT_OTHER): Payer: Self-pay | Admitting: Emergency Medicine

## 2024-03-16 ENCOUNTER — Other Ambulatory Visit: Payer: Self-pay

## 2024-03-16 ENCOUNTER — Emergency Department (HOSPITAL_BASED_OUTPATIENT_CLINIC_OR_DEPARTMENT_OTHER)
Admission: EM | Admit: 2024-03-16 | Discharge: 2024-03-16 | Disposition: A | Discharge status: Home / Self Care | Attending: Emergency Medicine | Admitting: Emergency Medicine

## 2024-03-16 DIAGNOSIS — W19XXXA Unspecified fall, initial encounter: Secondary | ICD-10-CM

## 2024-03-16 DIAGNOSIS — M79644 Pain in right finger(s): Secondary | ICD-10-CM | POA: Diagnosis present

## 2024-03-16 DIAGNOSIS — S63284A Dislocation of proximal interphalangeal joint of right ring finger, initial encounter: Secondary | ICD-10-CM | POA: Insufficient documentation

## 2024-03-16 DIAGNOSIS — S66504A Unspecified injury of intrinsic muscle, fascia and tendon of right ring finger at wrist and hand level, initial encounter: Secondary | ICD-10-CM | POA: Diagnosis not present

## 2024-03-16 DIAGNOSIS — S66509A Unspecified injury of intrinsic muscle, fascia and tendon of unspecified finger at wrist and hand level, initial encounter: Secondary | ICD-10-CM | POA: Insufficient documentation

## 2024-03-16 DIAGNOSIS — W010XXA Fall on same level from slipping, tripping and stumbling without subsequent striking against object, initial encounter: Secondary | ICD-10-CM | POA: Insufficient documentation

## 2024-03-16 DIAGNOSIS — S62624A Displaced fracture of medial phalanx of right ring finger, initial encounter for closed fracture: Secondary | ICD-10-CM | POA: Insufficient documentation

## 2024-03-16 DIAGNOSIS — S63259A Unspecified dislocation of unspecified finger, initial encounter: Secondary | ICD-10-CM

## 2024-03-16 MED ORDER — HYDROCODONE-ACETAMINOPHEN 5-325 MG PO TABS
1.0000 | ORAL_TABLET | Freq: Once | ORAL | Status: AC
  Administered 2024-03-16: 1 via ORAL
  Filled 2024-03-16: qty 1

## 2024-03-16 MED ORDER — OXYCODONE-ACETAMINOPHEN 5-325 MG PO TABS: 1.0000 | ORAL_TABLET | Freq: Four times a day (QID) | ORAL | 0 refills | Status: AC | PRN

## 2024-03-16 NOTE — Discharge Instructions (Addendum)
 Please follow-up with hand surgery in clinic due to concern for potential tendon injury of your finger in the setting of your finger dislocation this evening.  Your finger has been splinted. Recommend Tylenol  and Motrin  for pain control, Percocet for breakthrough pain. XR: IMPRESSION:  1. Successful reduction of the ring finger middle phalanx  dislocation.  2. 20 degrees extension at the ring finger PIP joint which could be  due to damage to a ventral ligament or tendon.

## 2024-03-16 NOTE — ED Provider Notes (Addendum)
 [facility] Provider Note   CSN: [id number] Arrival date & time: 03/16/24  9665     Patient presents with: Finger Injury and Fall   Misty Franco is a 58 y.o. female.   HPI   58 year old right-handed female presenting to the emergency department after a ground-level mechanical fall.  The patient states that she tripped this evening, landed on her right hand and specifically injured her right fourth digit.  She denies head trauma or loss of consciousness.  She denies any other injuries or complaints.  She endorses sharp pain with attempts at range of motion.  Prior to Admission medications   Medication Sig Start Date End Date Taking? Authorizing Provider  oxyCODONE -acetaminophen  (PERCOCET/ROXICET) 5-325 MG tablet Take 1 tablet by mouth every 6 (six) hours as needed for severe pain (pain score 7-10). 03/16/24  Yes Jerrol Agent, MD  albuterol  (PROVENTIL  HFA;VENTOLIN  HFA) 108 (90 Base) MCG/ACT inhaler Inhale 2 puffs into the lungs every 6 (six) hours as needed. 04/02/16   Smith, Kristi M, MD  amitriptyline  (ELAVIL ) 100 MG tablet Take 1 tablet (100 mg total) by mouth at bedtime. 02/01/15   Le, Thao P, DO  Beclomethasone Dipropionate  (QNASL ) 80 MCG/ACT AERS Place 1 spray into both nostrils 2 (two) times daily as needed (for allergies). Patient not taking: No sig reported 08/31/15   Claudene Rayfield HERO, MD  budesonide -formoterol  (SYMBICORT ) 160-4.5 MCG/ACT inhaler Inhale 2 puffs into the lungs 2 (two) times daily. 11/29/15   Smith, Kristi M, MD  gabapentin  (NEURONTIN ) 800 MG tablet Take 800 mg by mouth 3 (three) times daily. 10/10/23   [provider]  ibuprofen  (ADVIL ,MOTRIN ) 800 MG tablet TAKE 1 TABLET BY MOUTH THREE TIMES DAILY Patient not taking: No sig reported 11/28/15   Smith, Kristi M, MD  levalbuterol (XOPENEX) 1.25 MG/3ML nebulizer solution Take 1.25 mg by nebulization every 4 (four) hours as needed for wheezing.    [provider]   levocetirizine (XYZAL ) 5 MG tablet Take 1 tablet (5 mg total) by mouth every evening. Patient not taking: No sig reported 08/31/15   Claudene Rayfield HERO, MD  levothyroxine  (SYNTHROID , LEVOTHROID) 125 MCG tablet Take 1 tablet (125 mcg total) by mouth daily before breakfast. 04/02/16   Smith, Kristi M, MD  losartan (COZAAR) 25 MG tablet Take 25 mg by mouth daily. 10/08/23   [provider]  methocarbamol (ROBAXIN) 750 MG tablet Take 750 mg by mouth 2 (two) times daily. 12/11/23   [provider]  montelukast  (SINGULAIR ) 10 MG tablet Take 1 tablet (10 mg total) by mouth at bedtime. 08/31/15   Smith, Kristi M, MD  Moxifloxacin HCl (MOXEZA) 0.5 % SOLN Place 1 drop into both eyes daily as needed (for allergies). Patient not taking: No sig reported    [provider]  nabumetone (RELAFEN) 750 MG tablet Take 750 mg by mouth 2 (two) times daily. Patient not taking: Reported on 01/17/2024 12/30/23   [provider]  omeprazole  (PRILOSEC) 20 MG capsule Take 20 mg by mouth daily. 10/31/23   [provider]  OZEMPIC, 2 MG/DOSE, 8 MG/3ML SOPN Inject 2 mg into the skin once a week. 12/26/23   [provider]  pramipexole (MIRAPEX) 0.25 MG tablet Take 0.25 mg by mouth daily. Patient not taking: Reported on 01/17/2024 12/11/23   [provider]  predniSONE  (DELTASONE ) 20 MG tablet Two tablets daily x 5 days then one tablet daily x 5 days Patient not taking: No sig reported 08/31/15  Claudene Rayfield HERO, MD  rizatriptan  (MAXALT -MLT) 10 MG disintegrating tablet Take 1 tablet (10 mg total) by mouth as needed for migraine. May repeat in 2 hours if needed Patient not taking: No sig reported 07/22/15   Penumalli, Eduard SAUNDERS, MD  simvastatin  (ZOCOR ) 40 MG tablet TAKE 1 TABLET(40 MG) BY MOUTH DAILY 04/02/16   Smith, Kristi M, MD    Allergies: Penicillins and Iodine    Review of Systems  All other systems reviewed and are negative.   Updated Vital Signs BP 130/86 (BP Location:  Left Arm)   Pulse 90   Temp 97.9 F (36.6 C) (Oral)   Resp 16   Ht 5' 2 (1.575 m)   Wt 79.4 kg   SpO2 98%   BMI 32.01 kg/m   Physical Exam Vitals and nursing note reviewed.  Constitutional:      General: She is not in acute distress. HENT:     Head: Normocephalic and atraumatic.  Eyes:     Conjunctiva/sclera: Conjunctivae normal.     Pupils: Pupils are equal, round, and reactive to light.  Cardiovascular:     Rate and Rhythm: Normal rate and regular rhythm.  Pulmonary:     Effort: Pulmonary effort is normal. No respiratory distress.  Abdominal:     General: There is no distension.     Tenderness: There is no guarding.  Musculoskeletal:        General: Swelling and tenderness present.     Cervical back: Neck supple.     Comments: Right fourth digit held in slight flexion, tenderness to palpation with swelling noted  Skin:    Findings: No lesion or rash.  Neurological:     General: No focal deficit present.     Mental Status: She is alert. Mental status is at baseline.     (all labs ordered are listed, but only abnormal results are displayed) Labs Reviewed - No data to display  EKG: None  Radiology: DG Finger Ring Right Result Date: 03/16/2024 CLINICAL DATA:  Ring finger dislocation reduction. EXAM: RIGHT RING FINGER 2+V COMPARISON:  Earlier study today at 4:18 a.m. FINDINGS: Dorsal dislocation of the ring finger middle phalanx has been successfully reduced. On the lateral view there is 20 degrees extension at the ring finger PIP joint which could be due to damage to a ventral ligament or tendon. Please correlate clinically. There is no evidence of fractures or other focal bone abnormalities. Mild soft tissue swelling. IMPRESSION: 1. Successful reduction of the ring finger middle phalanx dislocation. 2. 20 degrees extension at the ring finger PIP joint which could be due to damage to a ventral ligament or tendon. Electronically Signed   By: Francis Quam M.D.   On:  03/16/2024 04:58   DG Hand Complete Right Result Date: 03/16/2024 CLINICAL DATA:  58 year old female status post fall with 4th finger injury and pain. EXAM: RIGHT HAND - COMPLETE 3+ VIEW COMPARISON:  None Available. FINDINGS: Three views. Complete dislocation of the 4th PIP. Dorsal dislocation by greater than 1 full shaft width and 8 mm proximal over riding of the dislocated middle phalanx. No fracture fragments are identified. Underlying normal bone mineralization. No other acute fracture or dislocation identified in the right hand. Bracelet artifact at the right wrist. IMPRESSION: Complete dorsal dislocation of the 4th PIP joint, with 8 mm overriding of. No acute fracture identified. Electronically Signed   By: VEAR Hurst M.D.   On: 03/16/2024 04:31     .Ortho Injury Treatment  Date/Time:  03/16/2024 4:33 AM  Performed by: Jerrol Agent, MD Authorized by: Jerrol Agent, MD   Consent:    Consent obtained:  Verbal   Consent given by:  Patient   Alternatives discussed:  No treatment and delayed treatmentInjury location: finger Location details: right ring finger Injury type: dislocation Dislocation type: PIP Pre-procedure neurovascular assessment: neurovascularly intact Pre-procedure distal perfusion: normal Pre-procedure neurological function: normal Pre-procedure range of motion: reduced  Anesthesia: Local anesthesia used: no  Patient sedated: NoManipulation performed: yes Reduction successful: yes X-ray confirmed reduction: yes Immobilization: tape and splint Splint Applied by: ED Nurse Supplies used: aluminum splint Post-procedure neurovascular assessment: post-procedure neurovascularly intact Post-procedure distal perfusion: normal Post-procedure neurological function: normal Post-procedure range of motion: improved Comments: Pt with persistent pain, worse when attempting to flex the 4th digit against resistance      Medications Ordered in the ED   HYDROcodone -acetaminophen  (NORCO/VICODIN) 5-325 MG per tablet 1 tablet (1 tablet Oral Given 03/16/24 0410)                                    Medical Decision Making Amount and/or Complexity of Data Reviewed Radiology: ordered.  Risk Prescription drug management.    58 year old right-handed female presenting to the emergency department after a ground-level mechanical fall.  The patient states that she tripped this evening, landed on her right hand and specifically injured her right fourth digit.  She denies head trauma or loss of consciousness.  She denies any other injuries or complaints.  She endorses sharp pain with attempts at range of motion.  On arrival, the patient was vitally stable.  On exam the patient showed evidence of injury to her fourth digit.  Considered tendon injury versus fracture.  Will obtain x-ray imaging to further evaluate. Norco provided for pain control.  X-ray right hand:  Dislocation of the PIP joint of the 4th digit on the right. IMPRESSION:  Complete dorsal dislocation of the 4th PIP joint, with 8 mm  overriding of. No acute fracture identified.   Reduction performed bedside as per the procedure note above.  Patient post reduction had reduced range of motion persistently.  I have concern for tendon injury.  The patient's finger was splinted bedside.  Will have the patient follow-up with hand surgery.   Post reduction film: IMPRESSION:  1. Successful reduction of the ring finger middle phalanx  dislocation.  2. 20 degrees extension at the ring finger PIP joint which could be  due to damage to a ventral ligament or tendon.   Patient provided with Percocet for breakthrough pain. Patient overall stable for discharge and close outpatient follow-up.        Final diagnoses:  Fall, initial encounter  Dislocation of finger, initial encounter  Injury of tendon of intrinsic muscle of finger    ED Discharge Orders          Ordered    Ambulatory  referral to Hand Surgery        03/16/24 0436    oxyCODONE -acetaminophen  (PERCOCET/ROXICET) 5-325 MG tablet  Every 6 hours PRN        03/16/24 0505               Jerrol Agent, MD 03/16/24 [id number]    Jerrol Agent, MD 03/16/24 [phone]

## 2024-03-16 NOTE — ED Triage Notes (Signed)
 Patient reports injury to right hand during mechanical fall this evening. Denies LOC. Denies head trauma. Pain mostly located in right ring finger.
